# Patient Record
Sex: Female | Born: 1985 | Race: White | Hispanic: No | Marital: Single | Smoking: Former smoker
Health system: Southern US, Community
[De-identification: ages and names within clinical notes are randomized; demographics above are authoritative.]

## PROBLEM LIST (undated history)

## (undated) DIAGNOSIS — R519 Headache, unspecified: Secondary | ICD-10-CM

## (undated) DIAGNOSIS — I10 Essential (primary) hypertension: Secondary | ICD-10-CM

## (undated) DIAGNOSIS — K219 Gastro-esophageal reflux disease without esophagitis: Secondary | ICD-10-CM

## (undated) DIAGNOSIS — G709 Myoneural disorder, unspecified: Secondary | ICD-10-CM

## (undated) DIAGNOSIS — Q059 Spina bifida, unspecified: Secondary | ICD-10-CM

## (undated) DIAGNOSIS — G600 Hereditary motor and sensory neuropathy: Secondary | ICD-10-CM

---

## 2011-09-10 ENCOUNTER — Emergency Department: Payer: Self-pay | Admitting: Emergency Medicine

## 2012-02-15 ENCOUNTER — Emergency Department: Payer: Self-pay | Admitting: Emergency Medicine

## 2013-03-29 ENCOUNTER — Emergency Department: Payer: Self-pay | Admitting: Internal Medicine

## 2013-09-10 ENCOUNTER — Ambulatory Visit: Payer: Self-pay | Admitting: Orthopedic Surgery

## 2013-10-28 ENCOUNTER — Emergency Department: Payer: Self-pay | Admitting: Emergency Medicine

## 2013-10-28 LAB — CBC
HCT: 40.2 % (ref 35.0–47.0)
HGB: 14 g/dL (ref 12.0–16.0)
MCH: 31.7 pg (ref 26.0–34.0)
MCHC: 34.7 g/dL (ref 32.0–36.0)
MCV: 91 fL (ref 80–100)
PLATELETS: 154 10*3/uL (ref 150–440)
RBC: 4.4 10*6/uL (ref 3.80–5.20)
RDW: 11.9 % (ref 11.5–14.5)
WBC: 6.6 10*3/uL (ref 3.6–11.0)

## 2013-10-28 LAB — HCG, QUANTITATIVE, PREGNANCY: BETA HCG, QUANT.: 3033 m[IU]/mL — AB

## 2013-10-30 ENCOUNTER — Emergency Department: Payer: Self-pay | Admitting: Emergency Medicine

## 2013-10-30 LAB — HCG, QUANTITATIVE, PREGNANCY: Beta Hcg, Quant.: 485 m[IU]/mL — ABNORMAL HIGH

## 2014-08-31 ENCOUNTER — Emergency Department: Payer: Self-pay | Admitting: Emergency Medicine

## 2014-08-31 LAB — SEDIMENTATION RATE: Erythrocyte Sed Rate: 5 mm/hr (ref 0–20)

## 2018-09-21 ENCOUNTER — Other Ambulatory Visit: Payer: Self-pay | Admitting: Physician Assistant

## 2018-09-21 ENCOUNTER — Ambulatory Visit
Admission: RE | Admit: 2018-09-21 | Discharge: 2018-09-21 | Disposition: A | Discharge status: Home / Self Care | Payer: Medicaid Other | Source: Ambulatory Visit | Attending: Physician Assistant | Admitting: Physician Assistant

## 2018-09-21 DIAGNOSIS — M542 Cervicalgia: Secondary | ICD-10-CM

## 2019-07-05 ENCOUNTER — Ambulatory Visit: Payer: Medicaid Other | Attending: Physician Assistant | Admitting: Physical Therapy

## 2019-07-05 ENCOUNTER — Other Ambulatory Visit: Payer: Self-pay

## 2019-07-05 ENCOUNTER — Encounter: Payer: Self-pay | Admitting: Physical Therapy

## 2019-07-05 DIAGNOSIS — G8929 Other chronic pain: Secondary | ICD-10-CM | POA: Insufficient documentation

## 2019-07-05 DIAGNOSIS — M545 Low back pain, unspecified: Secondary | ICD-10-CM

## 2019-07-05 DIAGNOSIS — M62838 Other muscle spasm: Secondary | ICD-10-CM | POA: Diagnosis present

## 2019-07-05 DIAGNOSIS — M542 Cervicalgia: Secondary | ICD-10-CM | POA: Diagnosis not present

## 2019-07-05 NOTE — Therapy (Signed)
[place] [facility] [address] [place], [address] Phone: [phone]   Fax:  [phone]  Physical Therapy Evaluation  Patient Details  Name: Kathleen Joseph MRN: [id number] Date of Birth: 12-02-1985 Referring Provider (PT): Arvilla Market, [facility]   Encounter Date: 07/05/2019  PT End of Session - 07/05/19 1135    Visit Number  1    Number of Visits  16    Date for PT Re-Evaluation  09/13/19    Authorization Type  Medicaid reporting from 07/05/2019    Authorization Time Period  certification period: 07/05/2019 - 09/13/2019    Authorization - Visit Number  1    Authorization - Number of Visits  1    PT Start Time  6759    PT Stop Time  1116    PT Time Calculation (min)  61 min    Activity Tolerance  Patient tolerated treatment well;Patient limited by pain    Behavior During Therapy  [facility] for tasks assessed/performed       History reviewed. No pertinent past medical history.  History reviewed. No pertinent surgical history.  There were no vitals filed for this visit.   Subjective Assessment - 07/05/19 1028    Subjective  Patient reports the onset of pain since many years ago located at bilateral cervical and lower back without trama or any known injuries. The pain is currently cervical 5/10, back 8/10. Patient reports aggravating factors including: prolonged sitting/standing/walking. Patient has been trying the following methods to relief the pain: heating pack and frequent resting breaks. However, none of the above methods had long lasting effect towards pain relief. Patient is currently taking care of her kids with help from her family. Patient enjoys playing with her kids and wish to do more exercises.    Pertinent History  Patient is a 33 y.o. female who presents to outpatient physical therapy with a referral for medical diagnosis of neck pain and low back pain. This patient's chief complaints consist of constant pain at  neck and lower back that has been chronically starting many years ago, leading to the following functional deficits: difficulty with housework activities, prolonged sitting, quality of life, lifting, etc.. Relevant past medical history and comorbidities include Marie-tooth-Charcot (2017); equivalent angina (2020), SOB, spinal bifida; surgeries include: Cesarean section.    Limitations  Sitting;Walking;Lifting;Standing;House hold activities    How long can you sit comfortably?  1 hour    How long can you stand comfortably?  30 minutes to 1 hour    How long can you walk comfortably?  15 minutes in a slow pace    Diagnostic tests  radiograph C-spine (including open mouth) report 09/21/2018: "Impression: Moderate disc space narrowing at C7-T1. Other disc spaces appear    Patient Stated Goals  Help to reduce the pain and able to do more    Currently in Pain?  Yes    Pain Score  5     Pain Location  Neck    Pain Orientation  Left;Right    Pain Descriptors / Indicators  Aching;Constant;Burning;Spasm    Pain Type  Chronic pain    Pain Onset  More than a month ago    Pain Frequency  Constant    Aggravating Factors   Lifting over 30 lb; neck extension with headachel; neck flexion; turning head fast while drive; prolonged house work    Pain Relieving Factors  Taking breaks during prolonged activites    Effect of Pain on Daily  Activities  Turning head fast while drive; prolonged house work; pick up her kids    Multiple Pain Sites  Yes    Pain Score  8    Pain Location  Back    Pain Orientation  Right;Left;Mid;Lower    Pain Descriptors / Indicators  Constant;Burning    Pain Type  Chronic pain    Pain Onset  More than a month ago    Pain Frequency  Constant    Aggravating Factors   heavy lifting over 30 lb; prolonged ADLs, IADLs    Pain Relieving Factors  Breaks between house working    Effect of Pain on Daily Activities  Turning head fast while drive; prolonged house work; pick up her kids              [facility] PT Assessment - 07/05/19 0001      Assessment   Medical Diagnosis  Cervical pain and lower back pain    Referring Provider (PT)  Mayme Genta, PAC    Onset Date/Surgical Date  --   Many years ago   Hand Dominance  Right    Prior Therapy  None      Precautions   Precautions  None      Restrictions   Weight Bearing Restrictions  No      Balance Screen   Has the patient fallen in the past 6 months  No    Has the patient had a decrease in activity level because of a fear of falling?   Yes      Home Environment   Living Environment  Private residence   Apartment with stairs    Living Arrangements  Children    Available Help at Discharge  Family    Type of Home  Apartment    Home Access  Stairs to enter      Prior Function   Level of Independence  Independent    Vocation  Works at home   taking care of children   Vocation Requirements  house working     Leisure  playing with kids       Cognition   Overall Cognitive Status  Within Functional Limits for tasks assessed      Observation/Other Assessments   Observations  see note from 07/05/2019 for latest objective measures    Focus on Therapeutic Outcomes (FOTO)   FOTO = 46         OBJECTIVE  Patient Specific Functional Scale:  1. Bending over to clean the tub 2. Vacuum the floor 3. Going up and down stairs.  (Deffered rating to next session)  SENSATION: Patient having numbness and tinglings at her BUE and BLE as baseline due to Marie-Tooth-Charcot (MTC)   MUSCULOSKELETAL: Tremor: None  Posture Slouched upper thoracic and excessive lodosis when sitting and walking.   Gait Deferred to later session.   Palpation Very sensitive to light touch at bilateral lower back at L1 to L5; tender palpation at B cervical regions at upper traps.   Strength (out of 5) R/L 4/4 Hip flexion 4-/4- Hip abduction (modified in sitting) 4/4 Hip adduction (modified in sitting) 4+/4+ Knee extension 4/4 Knee  flexion 5/5 Ankle dorsiflexion 4/4 Ankle inversion/eversion 4/4 Elbow flexion 4/4 Elbow extension 4/4 Shoulder abduction *Indicates pain   AROM (degrees) Cervical:  *Extension: 20 *Forward Flexion 25 L SB 40  R SB 25 (muscle tightness at L side neck)  Thoracic: Thoracic rotation with B arms cross over the chest R/L [facility]; no pain  with   Lumbar:  Forward flexion/side flexion WFL no pain  *Indicates pain   Repeated Movements Deferred to later session      Passive Accessory Intervertebral Motion (PAIVM) Deferred due to patient sensitivity to light touch at her lower back.    Objective measurements completed on examination: See above findings.    TREATMENT:   Therapeutic exercise: to centralize symptoms and improve ROM, strength, muscular endurance, and activity tolerance required for successful completion of functional activities.  - Seated cervical side bend with self overpressure   - RSB unable to achieve due to pain/lightheadedness with overpressure  - L SB 15 s with overpressure - Patient able to complete modified stretching exercises without overpressure and pain free. Patient demonstrated good understanding towards techniques and forms.  - Patient was educated on diagnosis, anatomy and pathology involved, prognosis, role of PT, and was given an HEP, demonstrating exercise with proper form following verbal and tactile cues, and was given a paper hand out to continue exercise at home. Pt was educated on and agreed to plan of care. - Education on HEP including handout   HOME EXERCISE PROGRAM Access Code: [email or web address]  URL: [email or web address]  Date: 07/05/2019  Prepared by: Norton BlizzardSara Snyder   Exercises Seated Cervical Sidebend with self Overpressure - Repeated Motions - 3 sets - 30 hold - 1x daily - 7x weekly Seated Transversus Abdominis Bracing - 1 sets - 20 reps - 2 hold - 1x daily - 7x weekly     PT Education - 07/05/19 1135    Education Details   Exercise purpose/form. Self management techniques. Education on diagnosis, prognosis, POC, anatomy and physiology of current condition Education on HEP including handout    Person(s) Educated  Patient    Methods  Explanation;Demonstration;Tactile cues;Verbal cues;Handout    Comprehension  Verbalized understanding;Returned demonstration;Verbal cues required;Tactile cues required       PT Short Term Goals - 07/05/19 1149      PT SHORT TERM GOAL #1   Title  Be independent with initial home exercise program for self-management of symptoms.    Baseline  initial HEP provided at IE (07/05/2019);    Time  2    Period  Weeks    Status  New    Target Date  07/26/19        PT Long Term Goals - 07/05/19 1150      PT LONG TERM GOAL #1   Title  Be independent with a long-term home exercise program for self-management of symptoms.    Baseline  initial HEP provided at IE (07/05/2019);    Time  10    Period  Weeks    Status  New    Target Date  09/13/19      PT LONG TERM GOAL #2   Title  Demonstrate improved FOTO score by 10 units to demonstrate improvement in overall condition and self-reported functional ability.    Baseline  FOTO: 46 (07/05/2019)    Time  10    Period  Weeks    Status  New    Target Date  09/13/19      PT LONG TERM GOAL #3   Title  Complete community, work and/or recreational activities without limitation due to current condition.    Baseline  heavy lifting over 30 lb; prolonged ADLs, IADLs; Turning head fast while drive; prolonged house work; pick up her kids (07/05/2019)    Time  10    Period  Weeks    Status  New  Target Date  09/13/19      PT LONG TERM GOAL #4   Title  Reduce pain with functional activities to equal or less than 1/10 to allow patient to complete usual activities including ADLs, IADLs, and social engagement with less difficulty.    Baseline  5/10 neck; 8/10 back(07/05/2019)    Time  10    Period  Weeks    Status  New    Target Date   09/13/19      PT LONG TERM GOAL #5   Title  Increase cervical ROM to 60 degrees for flexion/extension and 45 degrees to R/L side flexion to be able to complete ADLs/IADLs throughout the day    Baseline  Neck extenison 20 degrees; flexion 25 degrees; side bending R 25 L 40 degrees (07/05/2019)    Time  10    Period  Weeks    Status  New    Target Date  09/13/19             Plan - 07/05/19 1140    Clinical Impression Statement  Patient is a 33 y.o. female who presents to outpatient physical therapy with a referral for medical diagnosis of neck pain and low back pain. This patient presents with the sign and symptoms consistent with neck and low back pain, stiffness, muscle spasm with functional activities/recreational activities. Upon assessment, pt demonstrated deficits such as significant tenderness to palpation of paraspinal muscle at L2 to L5, as well as deficits in strength, mobility, weakness, significant pain with mobility, and limited neck ROM. These deficits limit the patient ability to perform things such as ADLs, IADLs, social participation, caring for and playing with children, engaging in hobbies (playing with kids), and impairs their quality of life. The pt will benefit from skilled PT services to address deficits and return to PLOF and independence, recreational activity and work.    Personal Factors and Comorbidities  Comorbidity 3+    Comorbidities  Marie-tooth-Charcot (2017); equivalent angina (2020), SOB, spinal bifida; surgeries include: Cesarean section.    Examination-Activity Limitations  Bend;Caring for Others;Carry;Lift;Sleep;Sit;Stand;Stairs    Examination-Participation Restrictions  Driving;Community Activity;Cleaning;Meal Prep;Laundry    Stability/Clinical Decision Making  Evolving/Moderate complexity    Clinical Decision Making  Moderate    Rehab Potential  Fair    PT Frequency  2x / week    PT Duration  Other (comment)   10 weeks   PT Treatment/Interventions   ADLs/Self Care Home Management;Cryotherapy;Aquatic Therapy;Electrical Stimulation;Moist Heat;Traction;Ultrasound;Gait Network engineer;Therapeutic activities;Therapeutic exercise;Balance training;Neuromuscular re-education;Patient/family education;Manual techniques;Energy conservation;Dry needling;Joint Manipulations;Other (comment)   joint mobilizations grades I-IV   PT Next Visit Plan  Stretching, muscle strengthening, postural education    PT Home Exercise Plan  Medbridge: [email or web address]    Consulted and Agree with Plan of Care  Patient       Patient will benefit from skilled therapeutic intervention in order to improve the following deficits and impairments:  Decreased activity tolerance, Decreased endurance, Decreased range of motion, Decreased strength, Impaired perceived functional ability, Impaired UE functional use, Improper body mechanics, Pain, Postural dysfunction, Impaired flexibility, Decreased balance, Difficulty walking, Increased muscle spasms  Visit Diagnosis: Cervicalgia  Chronic bilateral low back pain without sciatica  Other muscle spasm     Problem List There are no active problems to display for this patient.  Nelly Rout, SPT 07/05/19, 7:12 PM  Luretha Murphy. Ilsa Iha, PT, DPT 07/05/19, 7:12 PM  [facility] [address], [place], [address] Phone:  [phone]   Fax:  [phone]  Name: Kathleen Joseph MRN: [id number] Date of Birth: 1986-06-19

## 2019-07-12 ENCOUNTER — Encounter: Payer: Self-pay | Admitting: Physical Therapy

## 2019-07-12 ENCOUNTER — Ambulatory Visit: Payer: Medicaid Other | Admitting: Physical Therapy

## 2019-07-12 ENCOUNTER — Other Ambulatory Visit: Payer: Self-pay

## 2019-07-12 DIAGNOSIS — M545 Low back pain, unspecified: Secondary | ICD-10-CM

## 2019-07-12 DIAGNOSIS — M542 Cervicalgia: Secondary | ICD-10-CM | POA: Diagnosis not present

## 2019-07-12 DIAGNOSIS — G8929 Other chronic pain: Secondary | ICD-10-CM

## 2019-07-12 DIAGNOSIS — M62838 Other muscle spasm: Secondary | ICD-10-CM

## 2019-07-12 NOTE — Therapy (Addendum)
[facility] [address] [place], [address] Phone: [phone]   Fax:  [phone]  Physical Therapy Treatment  Patient Details  Name: Kathleen Joseph MRN: [id number] Date of Birth: 28-Apr-1986 Referring Provider (PT): Mayme Genta, [facility]   Encounter Date: 07/12/2019  PT End of Session - 07/12/19 1503    Visit Number  2    Number of Visits  16    Date for PT Re-Evaluation  09/13/19    Authorization Type  Medicaid reporting from 07/05/2019    Authorization Time Period  certification period: 07/05/2019 - 09/13/2019    Authorization - Visit Number  1    Authorization - Number of Visits  10    PT Start Time  1434    PT Stop Time  1515    PT Time Calculation (min)  41 min    Activity Tolerance  Patient tolerated treatment well;Patient limited by pain    Behavior During Therapy  [facility] for tasks assessed/performed       History reviewed. No pertinent past medical history.  History reviewed. No pertinent surgical history.  There were no vitals filed for this visit.  Subjective Assessment - 07/12/19 1501    Subjective  Patient reports no change of her neck and back pain since last visit. She has been doing her HEP and felt good right after the stretching. Rated pain as 7/10 for her neck and back.    Pertinent History  Patient is a 33 y.o. female who presents to outpatient physical therapy with a referral for medical diagnosis of neck pain and low back pain. This patient's chief complaints consist of constant pain at neck and lower back that has been chronically starting many years ago, leading to the following functional deficits: difficulty with housework activities, prolonged sitting, quality of life, lifting, etc.. Relevant past medical history and comorbidities include Marie-tooth-Charcot (2017); equivalent angina (2020), SOB, spinal bifida; surgeries include: Cesarean section.    Limitations   Sitting;Walking;Lifting;Standing;House hold activities    How long can you sit comfortably?  1 hour    How long can you stand comfortably?  30 minutes to 1 hour    How long can you walk comfortably?  15 minutes in a slow pace    Diagnostic tests  radiograph C-spine (including open mouth) report 09/21/2018: "Impression: Moderate disc space narrowing at C7-T1. Other disc spaces appear    Patient Stated Goals  Help to reduce the pain and able to do more    Currently in Pain?  Yes    Pain Score  7     Pain Onset  More than a month ago    Pain Onset  More than a month ago       Patient Specific Functional Scale:  1. Bending over to clean the tub: 3 2. Vacuum the floor: 4 3. Going up and down stairs: 3   Repeated Movements Lumbar extension x 10 reps increase irritation at lower back  Lumbar side bend x 10 bilaterally no change of symptoms  Prone press up (REIL)   Flat surface x10, no change, not getting to end range  Hands elevated on yoga blocks x10, no change, getting to end range. Required chin tuck.   Special test:  Lambert Mody purser: negative  Seated alar ligament test: negative in neutral and flexion, unable to properly test in extension.    TREATMENT:  Therapeutic exercise: to centralize symptoms and improve ROM, strength, muscular endurance, and activity  tolerance required for successful completion of functional activities.  - postural correction in seated with lumbar roll with education on how to use at home.  - Supine TA activation x 10reps  - Supine TA activation with marching 2x 10reps each side  - Standing lumbar extension x 10 reps minor increase of lumbar discomfort.  - Standing side bending x 10 reps at each side. No change of symptoms  - Prone press up x 10 reps  - Prone press up x 10 with yoga blocks.   Manual therapy: to reduce pain and tissue tension, improve range of motion, neuromodulation, in order to promote improved ability to complete functional activities. -  STM at bilateral upper traps to reduce tensions, trigger point, and pain control.   Access Code: [email or web address]  URL: [email or web address]  Date: 07/12/2019  Prepared by: Rosita Kea   Exercises Seated Cervical Sidebend with self Overpressure - Repeated Motions - 3 sets - 30 hold - 1x daily - 7x weekly Sidelying Thoracic Rotation with Open Book - 3 sets - 10 reps - 1x daily - 1x weekly Supine March - 3 sets - 10 reps - 1x daily - 7x weekly  Patient tolerated the session fair and continue working progress to her goal. Patient demonstrated increase activity tolerance and ROM with stretching exercise. Patient also states that she felt muscle burning after STM at bilateral upper trap L side more than R side, but able to recover quickly to her baseline.The pt will benefit from skilled PT services to address deficits and return to PLOF and independence, recreational activity and work.      PT Education - 07/12/19 1503    Education Details  Exercise purpose/form. Self management techniques. Updated HEP    Person(s) Educated  Patient    Methods  Explanation;Tactile cues;Demonstration;Verbal cues;Handout    Comprehension  Verbalized understanding;Returned demonstration;Verbal cues required;Tactile cues required       PT Short Term Goals - 07/05/19 1149      PT SHORT TERM GOAL #1   Title  Be independent with initial home exercise program for self-management of symptoms.    Baseline  initial HEP provided at IE (07/05/2019);    Time  2    Period  Weeks    Status  New    Target Date  07/26/19        PT Long Term Goals - 07/05/19 1150      PT LONG TERM GOAL #1   Title  Be independent with a long-term home exercise program for self-management of symptoms.    Baseline  initial HEP provided at IE (07/05/2019);    Time  10    Period  Weeks    Status  New    Target Date  09/13/19      PT LONG TERM GOAL #2   Title  Demonstrate improved FOTO score by 10 units to demonstrate  improvement in overall condition and self-reported functional ability.    Baseline  FOTO: 46 (07/05/2019)    Time  10    Period  Weeks    Status  New    Target Date  09/13/19      PT LONG TERM GOAL #3   Title  Complete community, work and/or recreational activities without limitation due to current condition.    Baseline  heavy lifting over 30 lb; prolonged ADLs, IADLs; Turning head fast while drive; prolonged house work; pick up her kids (07/05/2019)    Time  10    Period  Weeks    Status  New    Target Date  09/13/19      PT LONG TERM GOAL #4   Title  Reduce pain with functional activities to equal or less than 1/10 to allow patient to complete usual activities including ADLs, IADLs, and social engagement with less difficulty.    Baseline  5/10 neck; 8/10 back(07/05/2019)    Time  10    Period  Weeks    Status  New    Target Date  09/13/19      PT LONG TERM GOAL #5   Title  Increase cervical ROM to 60 degrees for flexion/extension and 45 degrees to R/L side flexion to be able to complete ADLs/IADLs throughout the day    Baseline  Neck extenison 20 degrees; flexion 25 degrees; side bending R 25 L 40 degrees (07/05/2019)    Time  10    Period  Weeks    Status  New    Target Date  09/13/19            Plan - 07/12/19 1723    Clinical Impression Statement  Patient tolerated the session fair and continue working progress to her goal. Patient demonstrated increase activity tolerance and ROM with stretching exercise. Patient also states that she felt muscle burning after STM at bilateral upper trap L side more than R side, but able to recover quickly to her baseline.The pt will benefit from skilled PT services to address deficits and return to PLOF and independence, recreational activity and work.    Personal Factors and Comorbidities  Comorbidity 3+    Comorbidities  Marie-tooth-Charcot (2017); equivalent angina (2020), SOB, spinal bifida; surgeries include: Cesarean section.     Examination-Activity Limitations  Bend;Caring for Others;Carry;Lift;Sleep;Sit;Stand;Stairs    Examination-Participation Restrictions  Driving;Community Activity;Cleaning;Meal Prep;Laundry    Stability/Clinical Decision Making  Evolving/Moderate complexity    Rehab Potential  Fair    PT Frequency  2x / week    PT Duration  Other (comment)   10 weeks   PT Treatment/Interventions  ADLs/Self Care Home Management;Cryotherapy;Aquatic Therapy;Electrical Stimulation;Moist Heat;Traction;Ultrasound;Gait Network engineertraining;Stair training;Therapeutic activities;Therapeutic exercise;Balance training;Neuromuscular re-education;Patient/family education;Manual techniques;Energy conservation;Dry needling;Joint Manipulations;Other (comment)   joint mobilizations grades I-IV   PT Next Visit Plan  Stretching, muscle strengthening, postural education    PT Home Exercise Plan  Medbridge: [email or web address]    Consulted and Agree with Plan of Care  Patient       Patient will benefit from skilled therapeutic intervention in order to improve the following deficits and impairments:  Decreased activity tolerance, Decreased endurance, Decreased range of motion, Decreased strength, Impaired perceived functional ability, Impaired UE functional use, Improper body mechanics, Pain, Postural dysfunction, Impaired flexibility, Decreased balance, Difficulty walking, Increased muscle spasms  Visit Diagnosis: Cervicalgia  Chronic bilateral low back pain without sciatica  Other muscle spasm     Problem List There are no active problems to display for this patient.  Nelly Routan Tristine Langi, SPT 07/12/19, 5:25 PM  Luretha MurphySara R. Ilsa IhaSnyder, PT, DPT 07/12/19, 5:30 PM   [facility] [address] [place], [place], [address] Phone: [phone]   Fax:  [phone]  Name: Kathleen Joseph MRN: [id number] Date of Birth: 07/19/1986

## 2019-07-19 ENCOUNTER — Encounter: Payer: Self-pay | Admitting: Physical Therapy

## 2019-07-19 ENCOUNTER — Other Ambulatory Visit: Payer: Self-pay

## 2019-07-19 ENCOUNTER — Ambulatory Visit: Payer: Medicaid Other | Admitting: Physical Therapy

## 2019-07-19 DIAGNOSIS — M542 Cervicalgia: Secondary | ICD-10-CM

## 2019-07-19 DIAGNOSIS — M62838 Other muscle spasm: Secondary | ICD-10-CM

## 2019-07-19 DIAGNOSIS — M545 Low back pain, unspecified: Secondary | ICD-10-CM

## 2019-07-19 DIAGNOSIS — G8929 Other chronic pain: Secondary | ICD-10-CM

## 2019-07-19 NOTE — Therapy (Signed)
[facility] [address] [place], [address] Phone: [phone]   Fax:  [phone]  Physical Therapy Treatment  Patient Details  Name: Kathleen Joseph MRN: [id number] Date of Birth: Jul 14, 1986 Referring Provider (PT): Arvilla Market, [facility]   Encounter Date: 07/19/2019  PT End of Session - 07/19/19 1618    Visit Number  3    Number of Visits  16    Date for PT Re-Evaluation  09/13/19    Authorization Type  Medicaid reporting from 07/05/2019    Authorization Time Period  certification period: 07/05/2019 - 09/13/2019    Authorization - Visit Number  2    Authorization - Number of Visits  10    PT Start Time  5277    PT Stop Time  1433    PT Time Calculation (min)  38 min    Activity Tolerance  Patient tolerated treatment well;Patient limited by pain    Behavior During Therapy  [facility] for tasks assessed/performed       History reviewed. No pertinent past medical history.  History reviewed. No pertinent surgical history.  There were no vitals filed for this visit.  Subjective Assessment - 07/19/19 1357    Subjective  Patient reports pain 6/10 for her neck and back. Patient had a rough day and very emotional.    Pertinent History  Patient is a 33 y.o. female who presents to outpatient physical therapy with a referral for medical diagnosis of neck pain and low back pain. This patient's chief complaints consist of constant pain at neck and lower back that has been chronically starting many years ago, leading to the following functional deficits: difficulty with housework activities, prolonged sitting, quality of life, lifting, etc.. Relevant past medical history and comorbidities include Marie-tooth-Charcot (2017); equivalent angina (2020), SOB, spinal bifida; surgeries include: Cesarean section.    Limitations  Sitting;Walking;Lifting;Standing;House hold activities    How long can you sit comfortably?  1 hour    How long  can you stand comfortably?  30 minutes to 1 hour    How long can you walk comfortably?  15 minutes in a slow pace    Diagnostic tests  radiograph C-spine (including open mouth) report 09/21/2018: "Impression: Moderate disc space narrowing at C7-T1. Other disc spaces appear    Patient Stated Goals  Help to reduce the pain and able to do more    Pain Onset  More than a month ago    Pain Onset  More than a month ago      TREATMENT: Therapeutic exercise:to centralize symptoms and improve ROM, strength, muscular endurance, and activity tolerance required for successful completion of functional activities. - Standing Shoulder row with red therapy band 2x15 reps  - Standing shoulder pull down with red therapy band 2x15 reps - Lats pull down 15/20/25lb 10 reps each  - Gluteal activation in standing 3s hold x 10 reps - Half squat 2x10 reps with cuing to gluteal activation from squat to standing phase. Patient reports she felt the starting of numbness of her hips at the beginning of the second set but able to recover really quickly after out of the position.  - Sit to standing 2 x 10 reps. Same feeling reported as half squat  - Standing repetitive extension x 10 against parallel bar at treadmill.  Cuing provided to improve shoulder relaxation, proper forms and technics.    HOME EXERCISE PROGRAM Access Code: [email or web address]  URL: [email or web address]  Date: 07/19/2019  Prepared by: Rosita Kea   Exercises Seated Cervical Sidebend with self Overpressure - Repeated Motions - 3 sets - 30 hold - 1x daily - 7x weekly Sidelying Thoracic Rotation with Open Book - 3 sets - 10 reps - 1x daily - 1x weekly Supine March - 3 sets - 10 reps - 1x daily - 7x weekly Shoulder extension with resistance - Neutral - 3 sets - 10 reps - 1x daily - 7x weekly Standing Bilateral Low Shoulder Row with Anchored Resistance - 3 sets - 10 reps - 1x daily - 7x weekly  Patient tolerated the session fair and minor  discomfort at lower back at the end. Patient demonstrated good muscle activation with proper form and techniques for UE strengthening exercises. Patient however, had difficulties with squat and pelvic anterior tilt in sitting. Future plan will focus on core stabilization, strengthening as well as postural training. The pt will benefit from skilled PT services to address deficits and return to PLOF and independence, recreational activity and work.       PT Education - 07/19/19 1618    Education Details  Exercise purpose/form. Self management techniques. Updated HEP    Person(s) Educated  Patient    Methods  Explanation;Demonstration;Tactile cues;Verbal cues    Comprehension  Verbalized understanding;Returned demonstration;Verbal cues required;Tactile cues required       PT Short Term Goals - 07/19/19 1409      PT SHORT TERM GOAL #1   Title  Be independent with initial home exercise program for self-management of symptoms.    Baseline  initial HEP provided at IE (07/05/2019);    Time  2    Period  Weeks    Status  Achieved    Target Date  07/26/19        PT Long Term Goals - 07/19/19 1409      PT LONG TERM GOAL #1   Title  Be independent with a long-term home exercise program for self-management of symptoms.    Baseline  initial HEP provided at IE (07/05/2019); updated 07/19/2019    Time  10    Period  Weeks    Status  Partially Met    Target Date  09/13/19      PT LONG TERM GOAL #2   Title  Demonstrate improved FOTO score by 10 units to demonstrate improvement in overall condition and self-reported functional ability.    Baseline  FOTO: 46 (07/05/2019)    Time  10    Period  Weeks    Status  On-going    Target Date  09/13/19      PT LONG TERM GOAL #3   Title  Complete community, work and/or recreational activities without limitation due to current condition.    Baseline  heavy lifting over 30 lb; prolonged ADLs, IADLs; Turning head fast while drive; prolonged house work;  pick up her kids (07/05/2019)    Time  10    Period  Weeks    Status  On-going    Target Date  09/13/19      PT LONG TERM GOAL #4   Title  Reduce pain with functional activities to equal or less than 1/10 to allow patient to complete usual activities including ADLs, IADLs, and social engagement with less difficulty.    Baseline  5/10 neck; 8/10 back(07/05/2019)    Time  10    Period  Weeks    Status  Partially Met    Target Date  09/13/19  PT LONG TERM GOAL #5   Title  Increase cervical ROM to 60 degrees for flexion/extension and 45 degrees to R/L side flexion to be able to complete ADLs/IADLs throughout the day    Baseline  Neck extenison 20 degrees; flexion 25 degrees; side bending R 25 L 40 degrees (07/05/2019)    Time  10    Period  Weeks    Status  On-going    Target Date  09/13/19            Plan - 07/19/19 1617    Clinical Impression Statement  Patient tolerated the session fair and minor discomfort at lower back at the end. Patient demonstrated good muscle activation with proper form and techniques for UE strengthening exercises. Patient however, had difficulties with squat and pelvic anterior tilt in sitting. Future plan will focus on core stabilization, strengthening as well as postural training. The pt will benefit from skilled PT services to address deficits and return to PLOF and independence, recreational activity and work.    Personal Factors and Comorbidities  Comorbidity 3+    Comorbidities  Marie-tooth-Charcot (2017); equivalent angina (2020), SOB, spinal bifida; surgeries include: Cesarean section.    Examination-Activity Limitations  Bend;Caring for Others;Carry;Lift;Sleep;Sit;Stand;Stairs    Examination-Participation Restrictions  Driving;Community Activity;Cleaning;Meal Prep;Laundry    Stability/Clinical Decision Making  Evolving/Moderate complexity    Rehab Potential  Fair    PT Frequency  2x / week    PT Duration  Other (comment)   10 weeks   PT  Treatment/Interventions  ADLs/Self Care Home Management;Cryotherapy;Aquatic Therapy;Electrical Stimulation;Moist Heat;Traction;Ultrasound;Gait Scientist, forensic;Therapeutic activities;Therapeutic exercise;Balance training;Neuromuscular re-education;Patient/family education;Manual techniques;Energy conservation;Dry needling;Joint Manipulations;Other (comment)   joint mobilizations grades I-IV   PT Next Visit Plan  Stretching, muscle strengthening, postural education    PT Home Exercise Plan  Medbridge: [email or web address]    Consulted and Agree with Plan of Care  Patient       Patient will benefit from skilled therapeutic intervention in order to improve the following deficits and impairments:  Decreased activity tolerance, Decreased endurance, Decreased range of motion, Decreased strength, Impaired perceived functional ability, Impaired UE functional use, Improper body mechanics, Pain, Postural dysfunction, Impaired flexibility, Decreased balance, Difficulty walking, Increased muscle spasms  Visit Diagnosis: Cervicalgia  Chronic bilateral low back pain without sciatica  Other muscle spasm     Problem List There are no active problems to display for this patient.   Sherrilyn Rist, SPT 07/19/19, 4:31 PM  Everlean Alstrom. Graylon Good, PT, DPT 07/19/19, 4:32 PM   [facility] [address] [place], [address] Phone: [phone]   Fax:  [phone]  Name: Kathleen Joseph MRN: [id number] Date of Birth: 04/06/1986

## 2019-07-26 ENCOUNTER — Ambulatory Visit: Payer: Medicaid Other | Admitting: Physical Therapy

## 2019-07-27 ENCOUNTER — Ambulatory Visit: Payer: Medicaid Other | Admitting: Physical Therapy

## 2019-07-27 ENCOUNTER — Encounter: Payer: Self-pay | Admitting: Physical Therapy

## 2019-07-27 ENCOUNTER — Other Ambulatory Visit: Payer: Self-pay

## 2019-07-27 DIAGNOSIS — M62838 Other muscle spasm: Secondary | ICD-10-CM

## 2019-07-27 DIAGNOSIS — M545 Low back pain, unspecified: Secondary | ICD-10-CM

## 2019-07-27 DIAGNOSIS — G8929 Other chronic pain: Secondary | ICD-10-CM

## 2019-07-27 DIAGNOSIS — M542 Cervicalgia: Secondary | ICD-10-CM

## 2019-07-27 NOTE — Therapy (Signed)
[facility] [address], [place], [address] Phone: [phone]   Fax:  [phone]  Physical Therapy Treatment/Progress Note  Reporting period 07/05/2019 to 07/27/2019  Patient Details  Name: Kathleen Joseph MRN: [id number] Date of Birth: [birth year] Referring Provider (PT): Arvilla Market, [facility]   Encounter Date: 07/27/2019  PT End of Session - 07/27/19 1356    Visit Number  4    Number of Visits  16    Date for PT Re-Evaluation  09/13/19    Authorization Type  Medicaid reporting from 07/05/2019    Authorization Time Period  certification period: 07/05/2019 - 09/13/2019    Authorization - Visit Number  3    Authorization - Number of Visits  3    PT Start Time  1344    PT Stop Time  1430    PT Time Calculation (min)  46 min    Activity Tolerance  Patient tolerated treatment well;Patient limited by pain    Behavior During Therapy  [facility] for tasks assessed/performed       History reviewed. No pertinent past medical history.  History reviewed. No pertinent surgical history.  There were no vitals filed for this visit.  Subjective Assessment - 07/27/19 1344    Subjective  Patient reports pain 7/10 for her neck and back. Patient states she didn't feel changes to her condition related to her pain since the PT treatment. She felt increased lower back pain after last session by doing repeated half squating    Pertinent History  Patient is a 33 y.o. female who presents to outpatient physical therapy with a referral for medical diagnosis of neck pain and low back pain. This patient's chief complaints consist of constant pain at neck and lower back that has been chronically starting many years ago, leading to the following functional deficits: difficulty with housework activities, prolonged sitting, quality of life, lifting, etc.. Relevant past medical history and comorbidities include Marie-tooth-Charcot (2017); equivalent  angina (2020), SOB, spinal bifida; surgeries include: Cesarean section.    Limitations  Sitting;Walking;Lifting;Standing;House hold activities    How long can you sit comfortably?  1 hour    How long can you stand comfortably?  30 minutes to 1 hour    How long can you walk comfortably?  15 minutes in a slow pace    Diagnostic tests  radiograph C-spine (including open mouth) report 09/21/2018: "Impression: Moderate disc space narrowing at C7-T1. Other disc spaces appear    Patient Stated Goals  Help to reduce the pain and able to do more    Currently in Pain?  Yes    Pain Score  7     Pain Location  Back    Pain Orientation  Left;Right    Pain Onset  More than a month ago    Pain Onset  More than a month ago      OBJECTIVE:  AROM (degrees) Cervical:  Extension: 30 Forward Flexion 33 L SB 45 R SB 40  No aggravating of pain at all motions  TREATMENT: Therapeutic exercise:to centralize symptoms and improve ROM, strength, muscular endurance, and activity tolerance required for successful completion of functional activities. - Upper body ergometer with no added resistance encourage joint nutrition, warm tissue, induce analgesic effect of aerobic exercise, improve muscular strength and endurance,  and prepare for remainder of session. x8 minutes during subjective exam.   - Standing Shoulder row with red therapy band 2x15 reps  - Standing shoulder pull  down with red therapy band 2x15 reps - Supine TA activation with marching 2x10 each LEs. Minimal increase of discomfort and HEP updated.  - Neck flexion, extension, side flexion x 2 to the end range.  - Patient was educated on treatment progress, anatomy and pathology involved, prognosis, role of PT, and was given an updated HEP, demonstrating exercise with proper form following verbal and tactile cues, and was given a paper hand out to continue exercise at home. Pt was educated on and agreed to plan of care. - (completed FOTO after end of  session unbilled)  Access Code: [email or web address]  URL: [email or web address]  Date: 07/27/2019  Prepared by: Rosita Kea   Exercises Seated Cervical Sidebend with self Overpressure - Repeated Motions - 3 sets - 30 hold - 1x daily - 7x weekly Sidelying Thoracic Rotation with Open Book - 3 sets - 10 reps - 1x daily - 1x weekly Supine March - 3 sets - 10 reps - 1x daily - 7x weekly Shoulder extension with resistance - Neutral - 3 sets - 10 reps - 1x daily - 7x weekly Standing Bilateral Low Shoulder Row with Anchored Resistance - 3 sets - 10 reps - 1x daily - 7x weekly Supine March - 3 sets - 20 reps - 1x daily - 7x weekly   Patient has attended 4 skilled physical therapy treatment sessions this episode of care and overall presents with fair progress towards stated goals. Subjectively, patient reports feeling no changes to her activity tolerance, ROM, self care pain control and pain reduction. Patient also reported she does not know if physical therapy will help since this is only her 3rd treatment sessions. Objectively, patient demonstrates good progression with increased pain free ROM at cervical regions, but stated the same for activity tolerance (houseworking), functional mobility (FOTO) pain level and and significant high iritability with cervical and lower back pain. Patient has been showing good gain in the PT treatments for allowable pain free ROM. However, patient continues to present with significant strength, pain, and activity tolerance impairments that are limiting ability to complete her usual ADLs, IADLs, community participation, manage her home, taking care of children, bend, lift, and ambulate community distances. Patient will benefit from continued skilled physical therapy intervention to address current body structure impairments and activity limitations to improve function and work towards goals set in current POC in order to return to prior level of function or maximal  functional improvement.    PT Education - 07/27/19 1355    Education Details  Exercise purpose/form. Self management techniques. Updated HEP    Person(s) Educated  Patient    Methods  Explanation;Demonstration;Tactile cues;Verbal cues    Comprehension  Verbalized understanding;Returned demonstration;Verbal cues required;Tactile cues required       PT Short Term Goals - 07/19/19 1409      PT SHORT TERM GOAL #1   Title  Be independent with initial home exercise program for self-management of symptoms.    Baseline  initial HEP provided at IE (07/05/2019);    Time  2    Period  Weeks    Status  Achieved    Target Date  07/26/19        PT Long Term Goals - 07/27/19 1708      PT LONG TERM GOAL #1   Title  Be independent with a long-term home exercise program for self-management of symptoms.    Baseline  initial HEP provided at IE (07/05/2019); updated 07/19/2019    Time  10  Period  Weeks    Status  On-going    Target Date  09/13/19      PT LONG TERM GOAL #2   Title  Demonstrate improved FOTO score by 10 units to demonstrate improvement in overall condition and self-reported functional ability.    Baseline  FOTO: 46 (07/05/2019); FOTO 46% (07/27/2019)    Time  10    Period  Weeks    Status  On-going    Target Date  09/13/19      PT LONG TERM GOAL #3   Title  Complete community, work and/or recreational activities without limitation due to current condition.    Baseline  heavy lifting over 30 lb; prolonged ADLs, IADLs; Turning head fast while drive; prolonged house work; pick up her kids (07/05/2019); Less difficulites with turnning head side ways while driving 06/23/7628_    Time  10    Period  Weeks    Status  On-going    Target Date  09/13/19      PT LONG TERM GOAL #4   Title  Reduce pain with functional activities to equal or less than 1/10 to allow patient to complete usual activities including ADLs, IADLs, and social engagement with less difficulty.    Baseline   5/10 neck; 8/10 back(07/05/2019); 7/10 for both neck and back 07/27/2019    Time  10    Period  Weeks    Status  Partially Met    Target Date  09/13/19      PT LONG TERM GOAL #5   Title  Increase cervical ROM to 60 degrees for flexion/extension and 45 degrees to R/L side flexion to be able to complete ADLs/IADLs throughout the day    Baseline  Neck extenison 20 degrees; flexion 25 degrees; side bending R 25 L 40 degrees (07/05/2019);Neck extenison 30 degrees; flexion 33 degrees; side bending R 40 L 44 degrees no pain(07/27/2019);    Time  10    Period  Weeks    Status  On-going    Target Date  09/13/19            Plan - 07/27/19 1707    Clinical Impression Statement  Patient has attended 4 skilled physical therapy treatment sessions this episode of care and overall presents with fair progress towards stated goals. Subjectively, patient reports feeling no changes to her activity tolerance, ROM, self care pain control and pain reduction. Patient also reported she does not know if physical therapy will help since this is only her 3rd treatment sessions. Objectively, patient demonstrates good progression with increased pain free ROM at cervical regions, but stated the same for activity tolerance (houseworking), functional mobility (FOTO) pain level and and significant high iritability with cervical and lower back pain. Patient has been showing good gain in the PT treatments for allowable pain free ROM. However, patient continues to present with significant strength, pain, and activity tolerance impairments that are limiting ability to complete her usual ADLs, IADLs, community participation, manage her home, taking care of children, bend, lift, and ambulate community distances. Patient will benefit from continued skilled physical therapy intervention to address current body structure impairments and activity limitations to improve function and work towards goals set in current POC in order to return  to prior level of function or maximal functional improvement.    Personal Factors and Comorbidities  Comorbidity 3+    Comorbidities  Marie-tooth-Charcot (2017); equivalent angina (2020), SOB, spinal bifida; surgeries include: Cesarean section.    Examination-Activity Limitations  Bend;Caring for Others;Carry;Lift;Sleep;Sit;Stand;Stairs    Examination-Participation Restrictions  Driving;Community Activity;Cleaning;Meal Prep;Laundry    Stability/Clinical Decision Making  Evolving/Moderate complexity    Rehab Potential  Fair    PT Frequency  2x / week    PT Duration  Other (comment)   10 weeks   PT Treatment/Interventions  ADLs/Self Care Home Management;Cryotherapy;Aquatic Therapy;Electrical Stimulation;Moist Heat;Traction;Ultrasound;Gait Scientist, forensic;Therapeutic activities;Therapeutic exercise;Balance training;Neuromuscular re-education;Patient/family education;Manual techniques;Energy conservation;Dry needling;Joint Manipulations;Other (comment)   joint mobilizations grades I-IV   PT Next Visit Plan  Stretching, muscle strengthening, postural education    PT Home Exercise Plan  Medbridge: [email or web address]    Consulted and Agree with Plan of Care  Patient       Patient will benefit from skilled therapeutic intervention in order to improve the following deficits and impairments:  Decreased activity tolerance, Decreased endurance, Decreased range of motion, Decreased strength, Impaired perceived functional ability, Impaired UE functional use, Improper body mechanics, Pain, Postural dysfunction, Impaired flexibility, Decreased balance, Difficulty walking, Increased muscle spasms  Visit Diagnosis: Cervicalgia  Chronic bilateral low back pain without sciatica  Other muscle spasm     Problem List There are no active problems to display for this patient.   Sherrilyn Rist, SPT 07/27/19, 5:50 PM  Everlean Alstrom. Graylon Good, PT, DPT 07/27/19, 5:50 PM   [facility] [address], [place], [address] Phone: [phone]   Fax:  [phone]  Name: Jameela Michna MRN: [id number] Date of Birth: 13-Nov-1985

## 2019-08-04 ENCOUNTER — Ambulatory Visit: Payer: Medicaid Other | Attending: Physician Assistant | Admitting: Physical Therapy

## 2019-08-09 ENCOUNTER — Ambulatory Visit: Payer: Medicaid Other | Admitting: Physical Therapy

## 2019-08-09 ENCOUNTER — Telehealth: Payer: Self-pay | Admitting: Physical Therapy

## 2019-08-09 NOTE — Telephone Encounter (Signed)
Patient called this PM due to no show at this AM PT appointment at 1115am. Patient stated that she forgot her appointment and she also had to take her daughter to dental's appointment. Patient informed for the further appointment on 08/11/2019 at 1030 am. Patient states that "I don't know if I can come and I will call back".   Sherrilyn Rist, SPT 08/09/2019   Student physical therapist under direct supervision of licensed physical therapists during the entirety of the session.   Everlean Alstrom. Graylon Good, PT, DPT 08/09/19, 4:51 PM

## 2019-08-11 ENCOUNTER — Ambulatory Visit: Payer: Medicaid Other | Admitting: Physical Therapy

## 2019-08-11 ENCOUNTER — Telehealth: Payer: Self-pay | Admitting: Physical Therapy

## 2019-08-11 NOTE — Telephone Encounter (Signed)
Called patient after she did not show up for her 10:30 appointment today. Patient answered and I informed her of the missed visit. She stated that she thought [place] told her the appointment was next Wednesday, not today. I offered times she could re-schedule. She said she could not come today because she has her kids with her, but agreed to come tomorrow 08/12/2019 at 10:30am.   [name] Graylon Good, PT, DPT 08/11/19, 10:59 AM

## 2019-08-12 ENCOUNTER — Encounter: Payer: Medicaid Other | Admitting: Physical Therapy

## 2019-08-12 DIAGNOSIS — M542 Cervicalgia: Secondary | ICD-10-CM

## 2019-08-12 DIAGNOSIS — G8929 Other chronic pain: Secondary | ICD-10-CM

## 2019-08-12 DIAGNOSIS — M62838 Other muscle spasm: Secondary | ICD-10-CM

## 2019-08-12 DIAGNOSIS — M545 Low back pain, unspecified: Secondary | ICD-10-CM

## 2019-08-12 NOTE — Therapy (Signed)
[facility] [address] [place], [address] Phone: [phone]   Fax:  [phone]  Physical Therapy Discharge Reporting period: 07/05/2019 - 08/12/2019  Patient Details  Name: Kathleen Joseph MRN: [id number] Date of Birth: Aug 28, 1986 Referring Provider (PT): Arvilla Market, [facility]   Encounter Date: 08/12/2019    No past medical history on file.  No past surgical history on file.  There were no vitals filed for this visit.  Subjective Assessment - 08/12/19 1904    Subjective  Patient called and left a message with front office staff that her pain med doctor told her she should dc PT since it was hurting her more than helping and requested to discharge from PT    Pertinent History  Patient is a 33 y.o. female who presents to outpatient physical therapy with a referral for medical diagnosis of neck pain and low back pain. This patient's chief complaints consist of constant pain at neck and lower back that has been chronically starting many years ago, leading to the following functional deficits: difficulty with housework activities, prolonged sitting, quality of life, lifting, etc.. Relevant past medical history and comorbidities include Marie-tooth-Charcot (2017); equivalent angina (2020), SOB, spinal bifida; surgeries include: Cesarean section.    Limitations  Sitting;Walking;Lifting;Standing;House hold activities    How long can you sit comfortably?  1 hour    How long can you stand comfortably?  30 minutes to 1 hour    How long can you walk comfortably?  15 minutes in a slow pace    Diagnostic tests  radiograph C-spine (including open mouth) report 09/21/2018: "Impression: Moderate disc space narrowing at C7-T1. Other disc spaces appear    Patient Stated Goals  Help to reduce the pain and able to do more    Pain Onset  More than a month ago    Pain Onset  More than a month ago       Objective:  Patient did not  present at discharge session for examination. Please see the previous notes for latest objective data.   Clinical impression:  Patient attended 4 therapy sessions. Patient demonstrated fair progress towards goals.. During this episode of care, patient had 4 visits,1 no show and 1 no show with rescheduling. Patient called this morning said her pain med doctor told her she should dc PT since it was hurting her more than helping and requested to discharge from PT. Patient now will be discharged due to her and her MD request.           PT Short Term Goals - 07/19/19 1409      PT SHORT TERM GOAL #1   Title  Be independent with initial home exercise program for self-management of symptoms.    Baseline  initial HEP provided at IE (07/05/2019);    Time  2    Period  Weeks    Status  Achieved    Target Date  07/26/19        PT Long Term Goals - 08/12/19 1737      PT LONG TERM GOAL #1   Title  Be independent with a long-term home exercise program for self-management of symptoms.    Baseline  initial HEP provided at IE (07/05/2019); updated 07/19/2019    Time  10    Period  Weeks    Status  Partially Met    Target Date  09/13/19      PT LONG TERM GOAL #2  Title  Demonstrate improved FOTO score by 10 units to demonstrate improvement in overall condition and self-reported functional ability.    Baseline  FOTO: 46 (07/05/2019); FOTO 46% (07/27/2019)    Time  10    Period  Weeks    Status  Partially Met    Target Date  09/13/19      PT LONG TERM GOAL #3   Title  Complete community, work and/or recreational activities without limitation due to current condition.    Baseline  heavy lifting over 30 lb; prolonged ADLs, IADLs; Turning head fast while drive; prolonged house work; pick up her kids (07/05/2019); Less difficulites with turnning head side ways while driving 01/65/5374_    Time  10    Period  Weeks    Status  Partially Met    Target Date  09/13/19      PT LONG TERM GOAL #4    Title  Reduce pain with functional activities to equal or less than 1/10 to allow patient to complete usual activities including ADLs, IADLs, and social engagement with less difficulty.    Baseline  5/10 neck; 8/10 back(07/05/2019); 7/10 for both neck and back 07/27/2019    Time  10    Period  Weeks    Status  Partially Met    Target Date  09/13/19      PT LONG TERM GOAL #5   Title  Increase cervical ROM to 60 degrees for flexion/extension and 45 degrees to R/L side flexion to be able to complete ADLs/IADLs throughout the day    Baseline  Neck extenison 20 degrees; flexion 25 degrees; side bending R 25 L 40 degrees (07/05/2019);Neck extenison 30 degrees; flexion 33 degrees; side bending R 40 L 44 degrees no pain(07/27/2019);    Time  10    Period  Weeks    Status  Partially Met    Target Date  09/13/19            Plan - 08/12/19 1905    Clinical Impression Statement  Patient attended 4 therapy sessions. Patient demonstrated fair progress towards goals.. During this episode of care, patient had 4 visits,1 no show and 1 no show with rescheduling. Patient called this morning said her pain med doctor told her she should dc PT since it was hurting her more than helping and requested to discharge from PT. Patient now will be discharged due to her and her MD request.    Personal Factors and Comorbidities  Comorbidity 3+    Comorbidities  Marie-tooth-Charcot (2017); equivalent angina (2020), SOB, spinal bifida; surgeries include: Cesarean section.    Examination-Activity Limitations  Bend;Caring for Others;Carry;Lift;Sleep;Sit;Stand;Stairs    Examination-Participation Restrictions  Driving;Community Activity;Cleaning;Meal Prep;Laundry    Stability/Clinical Decision Making  Evolving/Moderate complexity    Rehab Potential  Fair    PT Frequency  2x / week    PT Duration  Other (comment)   10 weeks   PT Treatment/Interventions  ADLs/Self Care Home Management;Cryotherapy;Aquatic  Therapy;Electrical Stimulation;Moist Heat;Traction;Ultrasound;Gait Scientist, forensic;Therapeutic activities;Therapeutic exercise;Balance training;Neuromuscular re-education;Patient/family education;Manual techniques;Energy conservation;Dry needling;Joint Manipulations;Other (comment)   joint mobilizations grades I-IV   PT Next Visit Plan  Patient is now discharged from physical therapy per reccomendation of pain physician per pt.    PT [place]: [id number]    Consulted and Agree with Plan of Care  Patient       Patient will benefit from skilled therapeutic intervention in order to improve the following deficits and impairments:  Decreased activity tolerance,  Decreased endurance, Decreased range of motion, Decreased strength, Impaired perceived functional ability, Impaired UE functional use, Improper body mechanics, Pain, Postural dysfunction, Impaired flexibility, Decreased balance, Difficulty walking, Increased muscle spasms  Visit Diagnosis: Cervicalgia  Chronic bilateral low back pain without sciatica  Other muscle spasm     Problem List There are no active problems to display for this patient.   Sherrilyn Rist, SPT 08/12/19, 7:06 PM   Everlean Alstrom. Graylon Good, PT, DPT 08/12/19, 7:06 PM    [facility] [address], [place], [address] Phone: [phone]   Fax:  [phone]  Name: Kathleen Joseph MRN: [id number] Date of Birth: January 23, 1986

## 2019-08-16 ENCOUNTER — Ambulatory Visit: Payer: Medicaid Other | Admitting: Physical Therapy

## 2019-08-18 ENCOUNTER — Encounter: Payer: Medicaid Other | Admitting: Physical Therapy

## 2019-08-23 ENCOUNTER — Encounter: Payer: Medicaid Other | Admitting: Physical Therapy

## 2019-08-25 ENCOUNTER — Encounter: Payer: Medicaid Other | Admitting: Physical Therapy

## 2019-08-30 ENCOUNTER — Encounter: Payer: Medicaid Other | Admitting: Physical Therapy

## 2019-09-01 ENCOUNTER — Encounter: Payer: Medicaid Other | Admitting: Physical Therapy

## 2019-09-02 ENCOUNTER — Other Ambulatory Visit: Payer: Self-pay | Admitting: Physician Assistant

## 2019-09-02 DIAGNOSIS — M542 Cervicalgia: Secondary | ICD-10-CM

## 2019-09-02 DIAGNOSIS — R29898 Other symptoms and signs involving the musculoskeletal system: Secondary | ICD-10-CM

## 2019-09-06 ENCOUNTER — Encounter: Payer: Medicaid Other | Admitting: Physical Therapy

## 2019-09-08 ENCOUNTER — Encounter: Payer: Medicaid Other | Admitting: Physical Therapy

## 2019-09-13 ENCOUNTER — Encounter: Payer: Medicaid Other | Admitting: Physical Therapy

## 2019-09-16 ENCOUNTER — Other Ambulatory Visit: Payer: Self-pay

## 2019-09-16 ENCOUNTER — Ambulatory Visit
Admission: RE | Admit: 2019-09-16 | Discharge: 2019-09-16 | Disposition: A | Discharge status: Home / Self Care | Payer: Medicaid Other | Source: Ambulatory Visit | Attending: Physician Assistant | Admitting: Physician Assistant

## 2019-09-16 DIAGNOSIS — R29898 Other symptoms and signs involving the musculoskeletal system: Secondary | ICD-10-CM | POA: Diagnosis present

## 2019-09-16 DIAGNOSIS — M542 Cervicalgia: Secondary | ICD-10-CM | POA: Diagnosis not present

## 2019-10-07 ENCOUNTER — Other Ambulatory Visit: Payer: Self-pay | Admitting: Physician Assistant

## 2019-10-07 DIAGNOSIS — G950 Syringomyelia and syringobulbia: Secondary | ICD-10-CM

## 2019-10-15 ENCOUNTER — Other Ambulatory Visit: Payer: Self-pay

## 2019-10-15 ENCOUNTER — Ambulatory Visit
Admission: RE | Admit: 2019-10-15 | Discharge: 2019-10-15 | Disposition: A | Discharge status: Home / Self Care | Payer: Medicaid Other | Source: Ambulatory Visit | Attending: Physician Assistant | Admitting: Physician Assistant

## 2019-10-15 DIAGNOSIS — G950 Syringomyelia and syringobulbia: Secondary | ICD-10-CM | POA: Diagnosis not present

## 2019-10-15 LAB — POCT I-STAT CREATININE: Creatinine, Ser: 0.8 mg/dL (ref 0.44–1.00)

## 2019-10-15 MED ORDER — GADOBUTROL 1 MMOL/ML IV SOLN
6.0000 mL | Freq: Once | INTRAVENOUS | Status: AC | PRN
  Administered 2019-10-15: 20:00:00 7.5 mL via INTRAVENOUS

## 2020-06-09 ENCOUNTER — Emergency Department
Admission: EM | Admit: 2020-06-09 | Discharge: 2020-06-09 | Disposition: A | Discharge status: Home / Self Care | Payer: Medicaid Other | Attending: Emergency Medicine | Admitting: Emergency Medicine

## 2020-06-09 DIAGNOSIS — I10 Essential (primary) hypertension: Secondary | ICD-10-CM | POA: Diagnosis present

## 2020-06-09 DIAGNOSIS — U071 COVID-19: Secondary | ICD-10-CM | POA: Diagnosis not present

## 2020-06-09 NOTE — ED Triage Notes (Signed)
Pt to ED reporting COVID like symptoms since Tuesday and is requesting testing. PT reports headaches and cold chills. No fevers at home. Pt has a hx of HTN and went to [facility] to check her BP and was concerned when it was elevated. Pt has been taking prescribed BP medication as prescribed. reports she checked her BP at [facility] and it was elevated

## 2020-06-09 NOTE — ED Notes (Signed)
Patient declined discharge vital signs. 

## 2020-06-09 NOTE — ED Provider Notes (Signed)
[facility] EMERGENCY DEPARTMENT Provider Note   CSN: [id number] Arrival date & time: 06/09/20  7017     History Chief Complaint  Patient presents with  . COVID symptoms  . Hypertension    Kathleen Joseph is a 34 y.o. female presents to the emergency department for evaluation of of high blood pressure and positive Covid test.  States 3 days ago she had sudden onset headaches fevers chills body aches cough and congestion.  She was exposed to her husband who was positive for Covid and today she went had a Covid test.  She was noted to have high blood pressure, 160/120 and come to the ER for evaluation and also to have her kids tested for Covid.  Patient had been taking anti-inflammatory indications as well as cough and cold medications with pseudoephedrine and phenylephrine.  She is on hydrochlorothiazide 7.5 mg daily.  She states her symptoms have subsided, no longer having any fevers chills body aches or cough.  She does have a little bit of a mild headache but overall she feels as if she is feeling much better.  She denies any vision changes, chest pain or shortness of breath  HPI     No past medical history on file.  There are no problems to display for this patient.   No past surgical history on file.   OB History   No obstetric history on file.     No family history on file.  Social History   Tobacco Use  . Smoking status: Not on file  Substance Use Topics  . Alcohol use: Not on file  . Drug use: Not on file    Home Medications Prior to Admission medications   Not on File    Allergies    Patient has no allergy information on record.  Review of Systems   Review of Systems  Constitutional: Negative for chills and fever.  HENT: Negative for sore throat.   Respiratory: Negative for cough and shortness of breath.   Cardiovascular: Negative for chest pain.  Gastrointestinal: Negative for nausea and vomiting.  Genitourinary: Negative for  frequency and pelvic pain.  Skin: Negative for rash and wound.  Neurological: Positive for headaches.    Physical Exam Updated Vital Signs BP (!) 157/95 (BP Location: Left Arm)   Pulse 82   Temp 98.4 F (36.9 C) (Oral)   Resp 16   Ht 5\' 4"  (1.626 m)   Wt 65.8 kg   SpO2 98%   BMI 24.89 kg/m   Physical Exam Constitutional:      Appearance: She is well-developed.  HENT:     Head: Normocephalic and atraumatic.  Eyes:     Conjunctiva/sclera: Conjunctivae normal.  Cardiovascular:     Rate and Rhythm: Normal rate.  Pulmonary:     Effort: Pulmonary effort is normal. No respiratory distress.     Breath sounds: Normal breath sounds. No stridor. No wheezing, rhonchi or rales.  Musculoskeletal:        General: Normal range of motion.     Cervical back: Normal range of motion.  Skin:    General: Skin is warm.     Findings: No rash.  Neurological:     Mental Status: She is alert and oriented to person, place, and time.  Psychiatric:        Behavior: Behavior normal.        Thought Content: Thought content normal.     ED Results / Procedures / Treatments  Labs (all labs ordered are listed, but only abnormal results are displayed) Labs Reviewed - No data to display  EKG None  Radiology No results found.  Procedures Procedures (including critical care time)  Medications Ordered in ED Medications - No data to display  ED Course  I have reviewed the triage vital signs and the nursing notes.  Pertinent labs & imaging results that were available during my care of the patient were reviewed by me and considered in my medical decision making (see chart for details).    MDM Rules/Calculators/A&P                          34 year old female with positive [facility] test [facility].  Symptoms began 3 days ago but all symptoms improving.  No chest pain cough or shortness of breath.  She has a very mild headache.  Afebrile.  Has been taking over-the-counter cough and cold  medication with ibuprofen, will have her discontinue these medications as they could be elevating her blood pressure.  Blood pressure today in the ER 157/95.  She will continue to monitor BP at home and call PCP with twice daily recordings.  She understands signs symptoms return to the ER for. Final Clinical Impression(s) / ED Diagnoses Final diagnoses:  Hypertension, unspecified type  COVID-19    Rx / DC Orders ED Discharge Orders    None       Ronnette Juniper 06/09/20 2117    Phineas Semen, MD 06/09/20 2119

## 2020-06-09 NOTE — Discharge Instructions (Addendum)
Please discontinue decongestants and NSAIDs such as ibuprofen.  You may take Tylenol for headaches.  If any fevers above 101, chest pain or shortness of breath return to the ER.  Please quarantine for 10 days from symptom onset

## 2020-07-28 ENCOUNTER — Encounter: Payer: Self-pay | Admitting: Obstetrics & Gynecology

## 2020-07-28 ENCOUNTER — Other Ambulatory Visit: Payer: Self-pay

## 2020-07-28 ENCOUNTER — Ambulatory Visit (INDEPENDENT_AMBULATORY_CARE_PROVIDER_SITE_OTHER): Payer: Medicaid Other | Admitting: Obstetrics & Gynecology

## 2020-07-28 VITALS — BP 120/80 | Ht 64.0 in | Wt 150.0 lb

## 2020-07-28 DIAGNOSIS — Z3009 Encounter for other general counseling and advice on contraception: Secondary | ICD-10-CM | POA: Diagnosis not present

## 2020-07-28 DIAGNOSIS — T8339XA Other mechanical complication of intrauterine contraceptive device, initial encounter: Secondary | ICD-10-CM | POA: Diagnosis not present

## 2020-07-28 NOTE — Progress Notes (Signed)
  Contraception Counseling Patient is a G3P3 WF, s/p CS x3 with youngest 4 years ago, presents for contraception counseling. The patient has no complaints today. The patient is sexually active.  She has had a Mirena IUD for the last 4 years, and desires more permanent sterilization.  This is her second IUD (had one in between prior pregnancies) and she has had problems with both trying to remove them, as the strings are lost and it is difficult for the provider to remove.  She does well w periods (none) and IUDs, but does not want another one.   PMHx: She  has no past medical history on file. Also,  has a past surgical history that includes Cesarean section., family history is negative for female cancers or concerns,  reports that she has never smoked. She has never used smokeless tobacco. She reports that she does not drink alcohol and does not use drugs.  She has a current medication list which includes the following prescription(s): hydrochlorothiazide, hydrocodone-acetaminophen, hysingla er, levetiracetam, meloxicam, and tizanidine. Also, has No Known Allergies.  Review of Systems  Constitutional: Negative for chills, fever and malaise/fatigue.  HENT: Negative for congestion, sinus pain and sore throat.   Eyes: Negative for blurred vision and pain.  Respiratory: Negative for cough and wheezing.   Cardiovascular: Negative for chest pain and leg swelling.  Gastrointestinal: Negative for abdominal pain, constipation, diarrhea, heartburn, nausea and vomiting.  Genitourinary: Negative for dysuria, frequency, hematuria and urgency.  Musculoskeletal: Negative for back pain, joint pain, myalgias and neck pain.  Skin: Negative for itching and rash.  Neurological: Negative for dizziness, tremors and weakness.  Endo/Heme/Allergies: Does not bruise/bleed easily.  Psychiatric/Behavioral: Negative for depression. The patient is not nervous/anxious and does not have insomnia.     Objective: BP 120/80    Ht 5\' 4"  (1.626 m)   Wt 150 lb (68 kg)   BMI 25.75 kg/m  Physical Exam Constitutional:      General: She is not in acute distress.    Appearance: She is well-developed.  Musculoskeletal:        General: Normal range of motion.  Neurological:     Mental Status: She is alert and oriented to person, place, and time.  Skin:    General: Skin is warm and dry.  Vitals reviewed.     ASSESSMENT/PLAN:    Problem List Items Addressed This Visit   Visit Diagnoses    Sterilization consult    -  Primary   Retained intrauterine contraceptive device (IUD)        Birth Control I discussed multiple birth control options and methods with the patient.  The risks and benefits of each were reviewed.  The possible side effects including deep venous thrombosis, breast tenderness, fluid retention, mood changes and abnormal vaginal bleeding were discussed.  Combination as well as progesterone-only options, pros and cons counseled.  Plan Lap BTL.  WIll plan IUD removal at that time, as strings are not visible on exam and this would be less traumatic to patient.  Counseled that periods will resume after comes off progesterone hormones, and will monitor over time.  Consent for tubal (state form) counseled and signed today.  Will schedule after 30 days.  PAP UTD w PCP.  Records reviewed from their office.  , MD, Annamarie Major Ob/Gyn, [facility] 07/28/2020  3:13 PM

## 2020-07-28 NOTE — Patient Instructions (Signed)
Surgery to Prevent Pregnancy Female sterilization is surgery to prevent pregnancy. In this surgery, the fallopian tubes are either blocked or closed off. When the fallopian tubes are closed, the eggs that the ovaries release cannot enter the uterus, sperm cannot reach the eggs, and you cannot get pregnant. Sterilization is permanent. It should only be done if you are sure that you do not want to be able to have children. What are the sterilization surgery options? There are several kinds of female sterilization surgeries. They include:  Laparoscopic tubal ligation. In this surgery, the fallopian tubes are tied off, sealed with heat, or blocked with a clip, ring, or clamp. A small portion of each fallopian tube may also be removed. This surgery is done through several small cuts (incisions) with special instruments that are inserted into your abdomen.  Postpartum tubal ligation. This is also called a mini-laparotomy. This surgery is done right after childbirth or 1 or 2 days after childbirth. In this surgery, the fallopian tubes are tied off, sealed with heat, or blocked with a clip, ring, or clamp. A small portion of each fallopian tube may also be removed. The surgery is done through a single incision in the abdomen.  Tubal ligation during a C-section. In this surgery, the fallopian tubes are tied off, sealed with heat, or blocked with a clip, ring, or clamp. A small portion of each fallopian tube may also be removed. The surgery is done at the same time as a C-section delivery. Is sterilization safe? Generally, sterilization is safe. Complications are rare. However, there are risks. They include:  Bleeding.  Infection.  Reaction to medicine used during the procedure.  Injury to surrounding organs.  Failure of the procedure. How effective is sterilization? Sterilization is nearly 100% effective, but it can fail. In rare cases, the fallopian tubes can grow back together over time. If this  happens, pregnancy may be possible and you will be able to get pregnant again. Women who have had this procedure have a higher chance of having an ectopic pregnancy. An ectopic pregnancy is a pregnancy that happens outside of the uterus. This kind of pregnancy can lead to serious bleeding if it is not treated. What are the benefits?  It is usually effective for a lifetime.  It is usually safe.  It does not have the drawbacks of other types of birth control in that your hormones are not affected. Because of this, your menstrual periods, sexual desire, and sexual performance will not be affected. What are the drawbacks?  You will need to recover and may have complications after surgery.  If you change your mind and decide that you want to have children, you may not be able to. Sterilization may be reversed, but a reversal is not always successful.  It does not provide protection against STDs (sexually transmitted diseases).  It increases the chance of having an ectopic pregnancy. Follow these instructions at home:  Keep all follow-up visits as told by your health care provider. This is important. Summary  Female sterilization is surgery to prevent pregnancy.  There are different types of female sterilization surgeries.  Sterilization may be reversed, but a reversal is not always successful.  Sterilization does not protect against STDs. This information is not intended to replace advice given to you by your health care provider. Make sure you discuss any questions you have with your health care provider. Document Revised: 03/03/2019 Document Reviewed: 05/29/2018 Elsevier Patient Education  2020 Elsevier Inc.  

## 2020-08-15 ENCOUNTER — Telehealth: Payer: Self-pay | Admitting: Obstetrics & Gynecology

## 2020-08-15 NOTE — Telephone Encounter (Signed)
Called patient to schedule Lap Tubal partial salpingectomy, IUD removal w [facility]  DOS 11/30  H&P 11/22 @ 11:20   Covid testing 11/29 @ 8-10:30, [address], drive up and wear mask. Advised pt to quarantine until DOS.  Pre-admit phone call appointment to be requested - date and time will be included on H&P paper work. Also all appointments will be updated on pt MyChart. Explained that this appointment has a call window. Based on the time scheduled will indicate if the call will be received within a 4 hour window before 1:00 or after.  Advised that pt may also receive calls from the hospital pharmacy and pre-service center.  Confirmed pt has [employer] as primary insurance. No secondary insurance.

## 2020-08-15 NOTE — Telephone Encounter (Signed)
-----   Message from Nadara Mustard, MD sent at 08/03/2020  5:41 PM EDT ----- Regarding: RE: SURGERY Best option is for me to do surgery 11/30 since I'm already blocking time out to be there. ----- Message ----- From: Letta Pate Sent: 08/03/2020   2:02 PM EDT To: Nadara Mustard, MD Subject: RE: SURGERY                                    Patient is OON with her choice of Medicaid. However, Harriett Sine said the new extended deadline for accepting OON pts is 08/29/20. The first day she is elligable to have the surgery would be Monday, 11/29 (which obviously would not work). In order for [place] to get paid to do her surgery, it would have to be done on 11/30. This is also the day that I have blocked you afternoon in clinic for the combo surgery with Dr Michaell Cowing.  There are 4 options (sort of): 1 - lunchtime surgery on Monday, 11/29 2 - afternoon surgery on Tuesday, 11/30 - before or after your combo surgery w/ Gross 3 - See if [facility] (on call) would do surgery after the already scheduled surgeries 4 - Adv pt she will either have to change her Medicaid plan or we refer her to a practice that accepts her plan  Please adv. ----- Message ----- From: Nadara Mustard, MD Sent: 07/28/2020   3:10 PM EDT To: April Payton Mccallum Subject: SURGERY                                        TUBAL PAPERS SIGNED TODAY  Surgery Booking Request Patient Full Name:  Kathleen Joseph  MRN: [id number]  DOB: [birth year]  Surgeon: Letitia Libra, MD  Requested Surgery Date and Time: >30 days from today Primary Diagnosis AND Code: Sterilization  Secondary Diagnosis and Code:  Surgical Procedure: Laparoscopy with Tubal Partial Salingectomy, Also, Removal of IUD RNFA Requested?: No L&D Notification: No Admission Status: same day surgery Length of Surgery: 50 min Special Case Needs: No H&P: Yes Phone Interview???:  Yes Interpreter: No Medical Clearance:  No Special Scheduling Instructions: No Any known health/anesthesia  issues, diabetes, sleep apnea, latex allergy, defibrillator/pacemaker?: No Acuity: P3   (P1 highest, P2 delay may cause harm, P3 low, elective gyn, P4 lowest)

## 2020-08-21 ENCOUNTER — Encounter: Payer: Self-pay | Admitting: Obstetrics & Gynecology

## 2020-08-21 ENCOUNTER — Other Ambulatory Visit: Payer: Self-pay

## 2020-08-21 ENCOUNTER — Ambulatory Visit (INDEPENDENT_AMBULATORY_CARE_PROVIDER_SITE_OTHER): Payer: Medicaid Other | Admitting: Obstetrics & Gynecology

## 2020-08-21 VITALS — BP 100/70 | Ht 64.0 in | Wt 152.0 lb

## 2020-08-21 DIAGNOSIS — Z3009 Encounter for other general counseling and advice on contraception: Secondary | ICD-10-CM

## 2020-08-21 DIAGNOSIS — T8339XA Other mechanical complication of intrauterine contraceptive device, initial encounter: Secondary | ICD-10-CM

## 2020-08-21 NOTE — H&P (View-Only) (Signed)
   PRE-OPERATIVE HISTORY AND PHYSICAL EXAM  HPI:  Kathleen Joseph is a 34 y.o. [id number] No LMP recorded.; she is being admitted for surgery related to requested sterilization. 3 prior CS, no desire for future pregnancies again.  PMHx: History reviewed. No pertinent past medical history. Past Surgical History:  Procedure Laterality Date  . CESAREAN SECTION     History reviewed. No pertinent family history. Social History   Tobacco Use  . Smoking status: Never Smoker  . Smokeless tobacco: Never Used  Vaping Use  . Vaping Use: Never used  Substance Use Topics  . Alcohol use: Never  . Drug use: Never    Current Outpatient Medications:  .  hydrochlorothiazide (MICROZIDE) 12.5 MG capsule, Take 12.5 mg by mouth daily., Disp: , Rfl:  .  HYDROcodone-acetaminophen (NORCO) 7.5-325 MG tablet, Take 1 tablet by mouth 4 (four) times daily as needed for moderate pain. , Disp: , Rfl:  .  HYSINGLA ER 20 MG T24A, Take 20 mg by mouth daily. , Disp: , Rfl:  .  levETIRAcetam (KEPPRA) 250 MG tablet, Take 250 mg by mouth 2 (two) times daily. , Disp: , Rfl:  .  levonorgestrel (MIRENA) 20 MCG/24HR IUD, 1 each by Intrauterine route once., Disp: , Rfl:  .  meloxicam (MOBIC) 15 MG tablet, Take 15 mg by mouth daily., Disp: , Rfl:  .  Multiple Vitamin (MULTIVITAMIN WITH MINERALS) TABS tablet, Take 1 tablet by mouth daily., Disp: , Rfl:  .  tiZANidine (ZANAFLEX) 4 MG tablet, Take 4 mg by mouth 2 (two) times daily as needed for muscle spasms. , Disp: , Rfl:  Allergies: Patient has no known allergies.  Review of Systems  Constitutional: Negative for chills, fever and malaise/fatigue.  HENT: Negative for congestion, sinus pain and sore throat.   Eyes: Negative for blurred vision and pain.  Respiratory: Negative for cough and wheezing.   Cardiovascular: Negative for chest pain and leg swelling.  Gastrointestinal: Negative for abdominal pain, constipation, diarrhea, heartburn, nausea and vomiting.    Genitourinary: Negative for dysuria, frequency, hematuria and urgency.  Musculoskeletal: Negative for back pain, joint pain, myalgias and neck pain.  Skin: Negative for itching and rash.  Neurological: Negative for dizziness, tremors and weakness.  Endo/Heme/Allergies: Does not bruise/bleed easily.  Psychiatric/Behavioral: Negative for depression. The patient is not nervous/anxious and does not have insomnia.     Objective: BP 100/70   Ht 5' 4" (1.626 m)   Wt 152 lb (68.9 kg)   BMI 26.09 kg/m   Filed Weights   08/21/20 1121  Weight: 152 lb (68.9 kg)   Physical Exam Constitutional:      General: She is not in acute distress.    Appearance: She is well-developed.  HENT:     Head: Normocephalic and atraumatic. No laceration.     Right Ear: Hearing normal.     Left Ear: Hearing normal.     Mouth/Throat:     Pharynx: Uvula midline.  Eyes:     Pupils: Pupils are equal, round, and reactive to light.  Neck:     Thyroid: No thyromegaly.  Cardiovascular:     Rate and Rhythm: Normal rate and regular rhythm.     Heart sounds: No murmur heard.  No friction rub. No gallop.   Pulmonary:     Effort: Pulmonary effort is normal. No respiratory distress.     Breath sounds: Normal breath sounds. No wheezing.  Chest:     Breasts:          Right: No mass, skin change or tenderness.        Left: No mass, skin change or tenderness.  Abdominal:     General: Bowel sounds are normal. There is no distension.     Palpations: Abdomen is soft.     Tenderness: There is no abdominal tenderness. There is no rebound.  Musculoskeletal:        General: Normal range of motion.     Cervical back: Normal range of motion and neck supple.  Neurological:     Mental Status: She is alert and oriented to person, place, and time.     Cranial Nerves: No cranial nerve deficit.  Skin:    General: Skin is warm and dry.  Psychiatric:        Judgment: Judgment normal.  Vitals reviewed.     Assessment: 1.  Sterilization consult   2. Retained intrauterine contraceptive device (IUD)   Remove IUD and partial salpingectomy planned  The patient has been fully informed about all methods of contraception, both temporary and permanent. She understands that tubal ligation is meant to be permanent, absolute and irreversible. She was told that there is an approximately 1 in 400 chance of a pregnancy in the future after tubal ligation. She was told the short and long term complications of tubal ligation. She understands the risks from this surgery include, but are not limited to, the risks of anesthesia, hemorrhage, infection, perforation, and injury to adjacent structures, bowel, bladder and blood vessels.   Annamarie Major, MD, [facility], [facility] 08/21/2020  11:55 AM

## 2020-08-21 NOTE — Progress Notes (Signed)
PRE-OPERATIVE HISTORY AND PHYSICAL EXAM  HPI:  Kathleen Joseph is a 34 y.o. [phone] No LMP recorded.; she is being admitted for surgery related to requested sterilization. 3 prior CS, no desire for future pregnancies again.  PMHx: History reviewed. No pertinent past medical history. Past Surgical History:  Procedure Laterality Date  . CESAREAN SECTION     History reviewed. No pertinent family history. Social History   Tobacco Use  . Smoking status: Never Smoker  . Smokeless tobacco: Never Used  Vaping Use  . Vaping Use: Never used  Substance Use Topics  . Alcohol use: Never  . Drug use: Never    Current Outpatient Medications:  .  hydrochlorothiazide (MICROZIDE) 12.5 MG capsule, Take 12.5 mg by mouth daily., Disp: , Rfl:  .  HYDROcodone-acetaminophen (NORCO) 7.5-325 MG tablet, Take 1 tablet by mouth 4 (four) times daily as needed for moderate pain. , Disp: , Rfl:  .  HYSINGLA ER 20 MG T24A, Take 20 mg by mouth daily. , Disp: , Rfl:  .  levETIRAcetam (KEPPRA) 250 MG tablet, Take 250 mg by mouth 2 (two) times daily. , Disp: , Rfl:  .  levonorgestrel (MIRENA) 20 MCG/24HR IUD, 1 each by Intrauterine route once., Disp: , Rfl:  .  meloxicam (MOBIC) 15 MG tablet, Take 15 mg by mouth daily., Disp: , Rfl:  .  Multiple Vitamin (MULTIVITAMIN WITH MINERALS) TABS tablet, Take 1 tablet by mouth daily., Disp: , Rfl:  .  tiZANidine (ZANAFLEX) 4 MG tablet, Take 4 mg by mouth 2 (two) times daily as needed for muscle spasms. , Disp: , Rfl:  Allergies: Patient has no known allergies.  Review of Systems  Constitutional: Negative for chills, fever and malaise/fatigue.  HENT: Negative for congestion, sinus pain and sore throat.   Eyes: Negative for blurred vision and pain.  Respiratory: Negative for cough and wheezing.   Cardiovascular: Negative for chest pain and leg swelling.  Gastrointestinal: Negative for abdominal pain, constipation, diarrhea, heartburn, nausea and vomiting.    Genitourinary: Negative for dysuria, frequency, hematuria and urgency.  Musculoskeletal: Negative for back pain, joint pain, myalgias and neck pain.  Skin: Negative for itching and rash.  Neurological: Negative for dizziness, tremors and weakness.  Endo/Heme/Allergies: Does not bruise/bleed easily.  Psychiatric/Behavioral: Negative for depression. The patient is not nervous/anxious and does not have insomnia.     Objective: BP 100/70   Ht 5\' 4"  (1.626 m)   Wt 152 lb (68.9 kg)   BMI 26.09 kg/m   Filed Weights   08/21/20 1121  Weight: 152 lb (68.9 kg)   Physical Exam Constitutional:      General: She is not in acute distress.    Appearance: She is well-developed.  HENT:     Head: Normocephalic and atraumatic. No laceration.     Right Ear: Hearing normal.     Left Ear: Hearing normal.     Mouth/Throat:     Pharynx: Uvula midline.  Eyes:     Pupils: Pupils are equal, round, and reactive to light.  Neck:     Thyroid: No thyromegaly.  Cardiovascular:     Rate and Rhythm: Normal rate and regular rhythm.     Heart sounds: No murmur heard.  No friction rub. No gallop.   Pulmonary:     Effort: Pulmonary effort is normal. No respiratory distress.     Breath sounds: Normal breath sounds. No wheezing.  Chest:     Breasts:  Right: No mass, skin change or tenderness.        Left: No mass, skin change or tenderness.  Abdominal:     General: Bowel sounds are normal. There is no distension.     Palpations: Abdomen is soft.     Tenderness: There is no abdominal tenderness. There is no rebound.  Musculoskeletal:        General: Normal range of motion.     Cervical back: Normal range of motion and neck supple.  Neurological:     Mental Status: She is alert and oriented to person, place, and time.     Cranial Nerves: No cranial nerve deficit.  Skin:    General: Skin is warm and dry.  Psychiatric:        Judgment: Judgment normal.  Vitals reviewed.     Assessment: 1.  Sterilization consult   2. Retained intrauterine contraceptive device (IUD)   Remove IUD and partial salpingectomy planned  The patient has been fully informed about all methods of contraception, both temporary and permanent. She understands that tubal ligation is meant to be permanent, absolute and irreversible. She was told that there is an approximately 1 in 400 chance of a pregnancy in the future after tubal ligation. She was told the short and long term complications of tubal ligation. She understands the risks from this surgery include, but are not limited to, the risks of anesthesia, hemorrhage, infection, perforation, and injury to adjacent structures, bowel, bladder and blood vessels.   Annamarie Major, MD, [facility], [facility] 08/21/2020  11:55 AM

## 2020-08-21 NOTE — Patient Instructions (Signed)
PRE ADMISSION TESTING For Covid, prior to procedure Monday 9:00-10:00 [facility] entrance (drive up)  Results in 38-25 hours You will not receive notification if test results are negative. If positive for Covid19, your provider will notify you by phone, with additional instructions.   Diagnostic Laparoscopy, Care After This sheet gives you information about how to care for yourself after your procedure. Your health care provider may also give you more specific instructions. If you have problems or questions, contact your health care provider. What can I expect after the procedure? After the procedure, it is common to have:  Mild discomfort in the abdomen.  Sore throat. Women who have laparoscopy with pelvic examination may have mild cramping and fluid coming from the vagina for a few days after the procedure. Follow these instructions at home: Medicines  Take over-the-counter and prescription medicines only as told by your health care provider.  If you were prescribed an antibiotic medicine, take it as told by your health care provider. Do not stop taking the antibiotic even if you start to feel better. Driving  Do not drive for 24 hours if you were given a medicine to help you relax (sedative) during your procedure.  Do not drive or use heavy machinery while taking prescription pain medicine. Bathing  Do not take baths, swim, or use a hot tub until your health care provider approves. You may take showers. Incision care   Follow instructions from your health care provider about how to take care of your incisions. Make sure you: ? Wash your hands with soap and water before you change your bandage (dressing). If soap and water are not available, use hand sanitizer. ? Change your dressing as told by your health care provider. ? Leave stitches (sutures), skin glue, or adhesive strips in place. These skin closures may need to stay in place for 2 weeks or longer. If  adhesive strip edges start to loosen and curl up, you may trim the loose edges. Do not remove adhesive strips completely unless your health care provider tells you to do that.  Check your incision areas every day for signs of infection. Check for: ? Redness, swelling, or pain. ? Fluid or blood. ? Warmth. ? Pus or a bad smell. Activity  Return to your normal activities as told by your health care provider. Ask your health care provider what activities are safe for you.  Do not lift anything that is heavier than 10 lb (4.5 kg), or the limit that you are told, until your health care provider says that it is safe. General instructions  To prevent or treat constipation while you are taking prescription pain medicine, your health care provider may recommend that you: ? Drink enough fluid to keep your urine pale yellow. ? Take over-the-counter or prescription medicines. ? Eat foods that are high in fiber, such as fresh fruits and vegetables, whole grains, and beans. ? Limit foods that are high in fat and processed sugars, such as fried and sweet foods.  Do not use any products that contain nicotine or tobacco, such as cigarettes and e-cigarettes. If you need help quitting, ask your health care provider.  Keep all follow-up visits as told by your health care provider. This is important. Contact a health care provider if:  You develop shoulder pain.  You feel lightheaded or faint.  You are unable to pass gas or have a bowel movement.  You feel nauseous or you vomit.  You develop a rash.  You  have redness, swelling, or pain around any incision.  You have fluid or blood coming from any incision.  Any incision feels warm to the touch.  You have pus or a bad smell coming from any incision.  You have a fever or chills. Get help right away if:  You have severe pain.  You have vomiting that does not go away.  You have heavy bleeding from the vagina.  Any incision opens.  You  have trouble breathing.  You have chest pain. Summary  After the procedure, it is common to have mild discomfort in the abdomen and a sore throat.  Check your incision areas every day for signs of infection.  Return to your normal activities as told by your health care provider. Ask your health care provider what activities are safe for you. This information is not intended to replace advice given to you by your health care provider. Make sure you discuss any questions you have with your health care provider. Document Revised: 08/29/2017 Document Reviewed: 03/12/2017 Elsevier Patient Education  [place].

## 2020-08-22 ENCOUNTER — Other Ambulatory Visit: Payer: Medicaid Other

## 2020-08-22 ENCOUNTER — Encounter
Admission: RE | Admit: 2020-08-22 | Discharge: 2020-08-22 | Disposition: A | Discharge status: Home / Self Care | Payer: Medicaid Other | Source: Ambulatory Visit | Attending: Obstetrics & Gynecology | Admitting: Obstetrics & Gynecology

## 2020-08-22 ENCOUNTER — Other Ambulatory Visit: Payer: Self-pay

## 2020-08-22 DIAGNOSIS — I10 Essential (primary) hypertension: Secondary | ICD-10-CM | POA: Insufficient documentation

## 2020-08-22 DIAGNOSIS — Z01818 Encounter for other preprocedural examination: Secondary | ICD-10-CM | POA: Insufficient documentation

## 2020-08-22 HISTORY — DX: Myoneural disorder, unspecified: G70.9

## 2020-08-22 HISTORY — DX: Essential (primary) hypertension: I10

## 2020-08-22 HISTORY — DX: Gastro-esophageal reflux disease without esophagitis: K21.9

## 2020-08-22 HISTORY — DX: Headache, unspecified: R51.9

## 2020-08-22 NOTE — Patient Instructions (Signed)
Your procedure is scheduled on: Tuesday 08/29/20.  Report to THE FIRST FLOOR REGISTRATION DESK IN THE MEDICAL MALL ON THE MORNING OF SURGERY FIRST, THEN YOU WILL CHECK IN AT THE SURGERY INFORMATION DESK LOCATED OUTSIDE THE SAME DAY SURGERY DEPARTMENT LOCATED ON 2ND FLOOR MEDICAL MALL ENTRANCE.  To find out your arrival time please call [phone] between 1PM - 3PM on Monday 08/28/20.   Remember: Instructions that are not followed completely may result in serious medical risk, up to and including death, or upon the discretion of your surgeon and anesthesiologist your surgery may need to be rescheduled.     __X__ 1. Do not eat food after midnight the night before your procedure.                 No gum chewing or hard candies. You may drink clear liquids up to 2 hours                 before you are scheduled to arrive for your surgery- DO NOT drink clear                 liquids within 2 hours of the start of your surgery.                 Clear Liquids include:  water, apple juice without pulp, clear carbohydrate                 drink such as Clearfast or Gatorade, Black Coffee or Tea (Do not add                 milk or creamer to coffee or tea).  __X__2.  On the morning of surgery brush your teeth with toothpaste and water, you may rinse your mouth with mouthwash if you wish.  Do not swallow any toothpaste or mouthwash.    __X__ 3.  No Alcohol for 24 hours before or after surgery.  __X__ 4.  Do Not Smoke or use e-cigarettes For 24 Hours Prior to Your Surgery.                 Do not use any chewable tobacco products for at least 6 hours prior to                 surgery.  __X__5.  Notify your doctor if there is any change in your medical condition      (cold, fever, infections).      Do NOT wear jewelry, make-up, hairpins, clips or nail polish. Do NOT wear lotions, powders, or perfumes.  Do NOT shave 48 hours prior to surgery. Men may shave face and neck. Do NOT bring valuables to the  hospital.     [facility] is not responsible for any belongings or valuables.   Contacts, dentures/partials or body piercings may not be worn into surgery. Bring a case for your contacts, glasses or hearing aids, a denture cup will be supplied.   Patients discharged the day of surgery will not be allowed to drive home.     __X__ Take these medicines the morning of surgery with A SIP OF WATER:     1. levETIRAcetam (KEPPRA)  2. HYDROcodone-acetaminophen (NORCO) if needed  3. tiZANidine (ZANAFLEX) if needed      __X__ Use CHG Soap as directed  __X__ Use inhalers on the day of surgery. Also bring the inhaler with you to the hospital on the morning of surgery.  __X__ Stop Anti-inflammatories 7 days before  surgery such as Advil, Ibuprofen, Motrin, BC or Goodies Powder, Naprosyn, Naproxen, Aleve, Aspirin, Meloxicam. May take Tylenol if needed for pain or discomfort.   __X__Do not start taking any new herbal supplements or vitamins prior to your procedure.     Wear comfortable clothing (specific to your surgery type) to the hospital.  Plan for stool softeners for home use; pain medications have a tendency to cause constipation. You can also help prevent constipation by eating foods high in fiber such as fruits and vegetables and drinking plenty of fluids as your diet allows.  After surgery, you can prevent lung complications by doing breathing exercises.Take deep breaths and cough every 1-2 hours. Your doctor may order a device called an Incentive Spirometer to help you take deep breaths.  Please call the [place] Department at [phone] if you have any questions about these instructions.

## 2020-08-22 NOTE — Pre-Procedure Instructions (Signed)
Patient does not need pre-surgery Covid testing. She was Covid positive on 06/09/20.  Contains abnormal dataLumiraDX SARS-COV-2 Rapid Result Antigen Test Specimen:  Other  Ref Range & Units 2 mo ago  LumiraDX SARS-COV-2 Rapid Result Antigen Test Negative, Invalid  PositiveAbnormal      INTERNAL CONTROLS VALID  Yes--Test working appropriately      Expiration Date  9282021      Lot Number  [id number]      Test Brand Name_Covid-19  Lumiradx Sars-Cov-2 AG Test      Resulting Agency  [facility] [id number]  Specimen Collected: 06/09/20 5:58 PM Last Resulted: 06/09/20 5:58 PM  Received From: [facility]  Result Received: 06/09/20 7:17 PM

## 2020-08-28 ENCOUNTER — Encounter
Admission: RE | Admit: 2020-08-28 | Discharge: 2020-08-28 | Disposition: A | Discharge status: Home / Self Care | Payer: Medicaid Other | Source: Ambulatory Visit | Attending: Obstetrics & Gynecology | Admitting: Obstetrics & Gynecology

## 2020-08-28 ENCOUNTER — Other Ambulatory Visit: Payer: Self-pay

## 2020-08-28 ENCOUNTER — Other Ambulatory Visit: Payer: Medicaid Other

## 2020-08-28 DIAGNOSIS — Z01810 Encounter for preprocedural cardiovascular examination: Secondary | ICD-10-CM

## 2020-08-28 DIAGNOSIS — Z01818 Encounter for other preprocedural examination: Secondary | ICD-10-CM | POA: Diagnosis not present

## 2020-08-28 DIAGNOSIS — I10 Essential (primary) hypertension: Secondary | ICD-10-CM | POA: Diagnosis not present

## 2020-08-28 LAB — BASIC METABOLIC PANEL
Anion gap: 10 (ref 5–15)
BUN: 11 mg/dL (ref 6–20)
CO2: 26 mmol/L (ref 22–32)
Calcium: 9.5 mg/dL (ref 8.9–10.3)
Chloride: 100 mmol/L (ref 98–111)
Creatinine, Ser: 0.51 mg/dL (ref 0.44–1.00)
GFR, Estimated: >60 mL/min (ref >60)
Glucose, Bld: 124 mg/dL — ABNORMAL HIGH (ref 70–99)
Potassium: 3.2 mmol/L — ABNORMAL LOW (ref 3.5–5.1)
Sodium: 136 mmol/L (ref 135–145)

## 2020-08-28 LAB — CBC
HCT: 41.1 % (ref 36.0–46.0)
Hemoglobin: 14.8 g/dL (ref 12.0–15.0)
MCH: 32 pg (ref 26.0–34.0)
MCHC: 36 g/dL (ref 30.0–36.0)
MCV: 88.8 fL (ref 80.0–100.0)
Platelets: 185 10*3/uL (ref 150–400)
RBC: 4.63 MIL/uL (ref 3.87–5.11)
RDW: 11.4 % — ABNORMAL LOW (ref 11.5–15.5)
WBC: 6.9 10*3/uL (ref 4.0–10.5)
nRBC: 0 % (ref 0.0–0.2)

## 2020-08-28 LAB — TYPE AND SCREEN
ABO/RH(D): O POS
Antibody Screen: NEGATIVE

## 2020-08-28 MED ORDER — LACTATED RINGERS IV SOLN: INTRAVENOUS | Status: DC

## 2020-08-28 MED ORDER — ORAL CARE MOUTH RINSE: 15.0000 mL | Freq: Once | OROMUCOSAL | Status: AC

## 2020-08-28 MED ORDER — CHLORHEXIDINE GLUCONATE 0.12 % MT SOLN: 15.0000 mL | Freq: Once | OROMUCOSAL | Status: AC

## 2020-08-28 MED ORDER — POVIDONE-IODINE 10 % EX SWAB: 2.0000 "application " | Freq: Once | CUTANEOUS | Status: DC

## 2020-08-29 ENCOUNTER — Ambulatory Visit: Payer: Medicaid Other

## 2020-08-29 ENCOUNTER — Encounter: Payer: Self-pay | Admitting: Obstetrics & Gynecology

## 2020-08-29 ENCOUNTER — Other Ambulatory Visit: Payer: Self-pay

## 2020-08-29 ENCOUNTER — Ambulatory Visit: Payer: Medicaid Other | Admitting: Registered Nurse

## 2020-08-29 ENCOUNTER — Ambulatory Visit
Admission: RE | Admit: 2020-08-29 | Discharge: 2020-08-29 | Disposition: A | Discharge status: Home / Self Care | Payer: Medicaid Other | Attending: Obstetrics & Gynecology | Admitting: Obstetrics & Gynecology

## 2020-08-29 ENCOUNTER — Encounter
Admission: RE | Disposition: A | Discharge status: Home / Self Care | Payer: Self-pay | Source: Home / Self Care | Attending: Obstetrics & Gynecology

## 2020-08-29 DIAGNOSIS — Z79899 Other long term (current) drug therapy: Secondary | ICD-10-CM | POA: Insufficient documentation

## 2020-08-29 DIAGNOSIS — Z302 Encounter for sterilization: Secondary | ICD-10-CM | POA: Diagnosis present

## 2020-08-29 DIAGNOSIS — N838 Other noninflammatory disorders of ovary, fallopian tube and broad ligament: Secondary | ICD-10-CM | POA: Insufficient documentation

## 2020-08-29 DIAGNOSIS — K219 Gastro-esophageal reflux disease without esophagitis: Secondary | ICD-10-CM | POA: Insufficient documentation

## 2020-08-29 DIAGNOSIS — I10 Essential (primary) hypertension: Secondary | ICD-10-CM | POA: Diagnosis not present

## 2020-08-29 DIAGNOSIS — T8332XA Displacement of intrauterine contraceptive device, initial encounter: Secondary | ICD-10-CM

## 2020-08-29 HISTORY — PX: LAPAROSCOPIC TUBAL LIGATION: SHX1937

## 2020-08-29 LAB — ABO/RH: ABO/RH(D): O POS

## 2020-08-29 SURGERY — LIGATION, FALLOPIAN TUBE, LAPAROSCOPIC
Anesthesia: General

## 2020-08-29 MED ORDER — FENTANYL CITRATE (PF) 100 MCG/2ML IJ SOLN
25.0000 ug | INTRAMUSCULAR | Status: DC | PRN
  Administered 2020-08-29 (×6): 25 ug via INTRAVENOUS

## 2020-08-29 MED ORDER — MIDAZOLAM HCL 2 MG/2ML IJ SOLN
INTRAMUSCULAR | Status: DC | PRN
  Administered 2020-08-29: 2 mg via INTRAVENOUS

## 2020-08-29 MED ORDER — FENTANYL CITRATE (PF) 100 MCG/2ML IJ SOLN
INTRAMUSCULAR | Status: AC
  Filled 2020-08-29: qty 2

## 2020-08-29 MED ORDER — ARTIFICIAL TEARS OPHTHALMIC OINT
TOPICAL_OINTMENT | OPHTHALMIC | Status: AC
  Filled 2020-08-29: qty 3.5

## 2020-08-29 MED ORDER — BUPIVACAINE HCL (PF) 0.5 % IJ SOLN
INTRAMUSCULAR | Status: AC
  Filled 2020-08-29: qty 30

## 2020-08-29 MED ORDER — HYDROCODONE-ACETAMINOPHEN 5-325 MG PO TABS: 1.0000 | ORAL_TABLET | ORAL | Status: DC | PRN

## 2020-08-29 MED ORDER — LACTATED RINGERS IV SOLN: INTRAVENOUS | Status: DC

## 2020-08-29 MED ORDER — LIDOCAINE HCL (CARDIAC) PF 100 MG/5ML IV SOSY
PREFILLED_SYRINGE | INTRAVENOUS | Status: DC | PRN
  Administered 2020-08-29: 80 mg via INTRATRACHEAL

## 2020-08-29 MED ORDER — BUPIVACAINE HCL 0.5 % IJ SOLN
INTRAMUSCULAR | Status: DC | PRN
  Administered 2020-08-29: 10 mL

## 2020-08-29 MED ORDER — SUGAMMADEX SODIUM 500 MG/5ML IV SOLN
INTRAVENOUS | Status: AC
  Filled 2020-08-29: qty 10

## 2020-08-29 MED ORDER — MORPHINE SULFATE (PF) 2 MG/ML IV SOLN: 1.0000 mg | INTRAVENOUS | Status: DC | PRN

## 2020-08-29 MED ORDER — ONDANSETRON HCL 4 MG/2ML IJ SOLN: 4.0000 mg | Freq: Once | INTRAMUSCULAR | Status: DC | PRN

## 2020-08-29 MED ORDER — HYDROCODONE-ACETAMINOPHEN 5-325 MG PO TABS: 1.0000 | ORAL_TABLET | ORAL | 0 refills | Status: DC | PRN

## 2020-08-29 MED ORDER — SUGAMMADEX SODIUM 200 MG/2ML IV SOLN
INTRAVENOUS | Status: DC | PRN
  Administered 2020-08-29: 200 mg via INTRAVENOUS

## 2020-08-29 MED ORDER — ONDANSETRON HCL 4 MG/2ML IJ SOLN
INTRAMUSCULAR | Status: AC
  Filled 2020-08-29: qty 2

## 2020-08-29 MED ORDER — DIPHENHYDRAMINE HCL 50 MG/ML IJ SOLN
INTRAMUSCULAR | Status: AC
  Filled 2020-08-29: qty 1

## 2020-08-29 MED ORDER — HYDROCODONE-ACETAMINOPHEN 5-325 MG PO TABS
ORAL_TABLET | ORAL | Status: AC
  Filled 2020-08-29: qty 1

## 2020-08-29 MED ORDER — ACETAMINOPHEN 325 MG PO TABS: 650.0000 mg | ORAL_TABLET | ORAL | Status: DC | PRN

## 2020-08-29 MED ORDER — CHLORHEXIDINE GLUCONATE 0.12 % MT SOLN
OROMUCOSAL | Status: AC
  Administered 2020-08-29: 15 mL via OROMUCOSAL
  Filled 2020-08-29: qty 15

## 2020-08-29 MED ORDER — PROPOFOL 10 MG/ML IV BOLUS
INTRAVENOUS | Status: AC
  Filled 2020-08-29: qty 20

## 2020-08-29 MED ORDER — DEXMEDETOMIDINE (PRECEDEX) IN NS 20 MCG/5ML (4 MCG/ML) IV SYRINGE
PREFILLED_SYRINGE | INTRAVENOUS | Status: AC
  Filled 2020-08-29: qty 5

## 2020-08-29 MED ORDER — EPHEDRINE 5 MG/ML INJ
INTRAVENOUS | Status: AC
  Filled 2020-08-29: qty 10

## 2020-08-29 MED ORDER — DEXAMETHASONE SODIUM PHOSPHATE 10 MG/ML IJ SOLN
INTRAMUSCULAR | Status: DC | PRN
  Administered 2020-08-29: 10 mg via INTRAVENOUS

## 2020-08-29 MED ORDER — ACETAMINOPHEN 650 MG RE SUPP
650.0000 mg | RECTAL | Status: DC | PRN
  Filled 2020-08-29: qty 1

## 2020-08-29 MED ORDER — ROCURONIUM BROMIDE 10 MG/ML (PF) SYRINGE
PREFILLED_SYRINGE | INTRAVENOUS | Status: AC
  Filled 2020-08-29: qty 10

## 2020-08-29 MED ORDER — ONDANSETRON HCL 4 MG/2ML IJ SOLN
INTRAMUSCULAR | Status: DC | PRN
Start: 2020-08-29 — End: 2020-08-29
  Administered 2020-08-29: 4 mg via INTRAVENOUS

## 2020-08-29 MED ORDER — FENTANYL CITRATE (PF) 100 MCG/2ML IJ SOLN
INTRAMUSCULAR | Status: DC | PRN
  Administered 2020-08-29 (×6): 50 ug via INTRAVENOUS

## 2020-08-29 MED ORDER — DEXMEDETOMIDINE (PRECEDEX) IN NS 20 MCG/5ML (4 MCG/ML) IV SYRINGE
PREFILLED_SYRINGE | INTRAVENOUS | Status: DC | PRN
Start: 2020-08-29 — End: 2020-08-29
  Administered 2020-08-29: 8 ug via INTRAVENOUS
  Administered 2020-08-29: 4 ug via INTRAVENOUS
  Administered 2020-08-29: 8 ug via INTRAVENOUS

## 2020-08-29 MED ORDER — KETOROLAC TROMETHAMINE 30 MG/ML IJ SOLN
INTRAMUSCULAR | Status: DC | PRN
  Administered 2020-08-29: 30 mg via INTRAVENOUS

## 2020-08-29 MED ORDER — HYDROCODONE-ACETAMINOPHEN 5-325 MG PO TABS
1.0000 | ORAL_TABLET | Freq: Once | ORAL | Status: AC
  Administered 2020-08-29: 1 via ORAL

## 2020-08-29 MED ORDER — PROPOFOL 10 MG/ML IV BOLUS
INTRAVENOUS | Status: DC | PRN
  Administered 2020-08-29: 50 mg via INTRAVENOUS
  Administered 2020-08-29: 200 mg via INTRAVENOUS

## 2020-08-29 MED ORDER — DEXAMETHASONE SODIUM PHOSPHATE 10 MG/ML IJ SOLN
INTRAMUSCULAR | Status: AC
  Filled 2020-08-29: qty 1

## 2020-08-29 MED ORDER — ROCURONIUM BROMIDE 100 MG/10ML IV SOLN
INTRAVENOUS | Status: DC | PRN
  Administered 2020-08-29: 60 mg via INTRAVENOUS

## 2020-08-29 MED ORDER — PROPOFOL 500 MG/50ML IV EMUL
INTRAVENOUS | Status: AC
  Filled 2020-08-29: qty 50

## 2020-08-29 MED ORDER — MIDAZOLAM HCL 2 MG/2ML IJ SOLN
INTRAMUSCULAR | Status: AC
  Filled 2020-08-29: qty 2

## 2020-08-29 SURGICAL SUPPLY — 42 items
ADH SKN CLS APL DERMABOND .7 (GAUZE/BANDAGES/DRESSINGS) ×2
APL PRP STRL LF DISP 70% ISPRP (MISCELLANEOUS) ×2
APL SWBSTK 6 STRL LF DISP (MISCELLANEOUS) ×10
APPLICATOR COTTON TIP 6 STRL (MISCELLANEOUS) ×10 IMPLANT
APPLICATOR COTTON TIP 6IN STRL (MISCELLANEOUS) ×20
BAG COUNTER SPONGE EZ (MISCELLANEOUS) ×3 IMPLANT
BAG SPNG 4X4 CLR HAZ (MISCELLANEOUS) ×2
BLADE SURG SZ11 CARB STEEL (BLADE) ×4 IMPLANT
CANISTER SUCT 1200ML W/VALVE (MISCELLANEOUS) ×4 IMPLANT
CATH ROBINSON RED A/P 16FR (CATHETERS) ×4 IMPLANT
CHLORAPREP W/TINT 26 (MISCELLANEOUS) ×4 IMPLANT
COUNTER SPONGE BAG EZ (MISCELLANEOUS) ×1
COVER WAND RF STERILE (DRAPES) IMPLANT
DERMABOND ADVANCED (GAUZE/BANDAGES/DRESSINGS) ×2
DERMABOND ADVANCED .7 DNX12 (GAUZE/BANDAGES/DRESSINGS) ×2 IMPLANT
DRSG TEGADERM 2-3/8X2-3/4 SM (GAUZE/BANDAGES/DRESSINGS) ×8 IMPLANT
GLOVE BIO SURGEON STRL SZ7.5 (GLOVE) ×4 IMPLANT
GLOVE BIO SURGEON STRL SZ8 (GLOVE) ×4 IMPLANT
GLOVE INDICATOR 8.0 STRL GRN (GLOVE) ×4 IMPLANT
GOWN STRL REUS W/ TWL LRG LVL3 (GOWN DISPOSABLE) ×2 IMPLANT
GOWN STRL REUS W/ TWL XL LVL3 (GOWN DISPOSABLE) ×2 IMPLANT
GOWN STRL REUS W/TWL LRG LVL3 (GOWN DISPOSABLE) ×4
GOWN STRL REUS W/TWL XL LVL3 (GOWN DISPOSABLE) ×4
LABEL OR SOLS (LABEL) ×4 IMPLANT
MANIFOLD NEPTUNE II (INSTRUMENTS) ×4 IMPLANT
NEEDLE VERESS 14GA 120MM (NEEDLE) ×4 IMPLANT
NS IRRIG 500ML POUR BTL (IV SOLUTION) ×4 IMPLANT
PACK DNC HYST (MISCELLANEOUS) ×4 IMPLANT
PACK GYN LAPAROSCOPIC (MISCELLANEOUS) ×4 IMPLANT
PAD OB MATERNITY 4.3X12.25 (PERSONAL CARE ITEMS) ×4 IMPLANT
PAD PREP 24X41 OB/GYN DISP (PERSONAL CARE ITEMS) ×4 IMPLANT
SET TUBE SMOKE EVAC HIGH FLOW (TUBING) ×4 IMPLANT
SHEARS HARMONIC ACE PLUS 36CM (ENDOMECHANICALS) IMPLANT
SPONGE GAUZE 2X2 8PLY STER LF (GAUZE/BANDAGES/DRESSINGS) ×2
SPONGE GAUZE 2X2 8PLY STRL LF (GAUZE/BANDAGES/DRESSINGS) ×6 IMPLANT
STRAP SAFETY 5IN WIDE (MISCELLANEOUS) ×4 IMPLANT
SUT VIC AB 2-0 UR6 27 (SUTURE) ×4 IMPLANT
SUT VIC AB 4-0 PS2 18 (SUTURE) ×8 IMPLANT
SYR 10ML LL (SYRINGE) ×8 IMPLANT
TOWEL OR 17X26 4PK STRL BLUE (TOWEL DISPOSABLE) ×4 IMPLANT
TROCAR ENDO BLADELESS 11MM (ENDOMECHANICALS) IMPLANT
TROCAR XCEL NON-BLD 5MMX100MML (ENDOMECHANICALS) ×4 IMPLANT

## 2020-08-29 NOTE — Discharge Instructions (Signed)
Laparoscopic Tubal Ligation, Care After This sheet gives you information about how to care for yourself after your procedure. Your health care provider may also give you more specific instructions. If you have problems or questions, contact your health care provider. What can I expect after the procedure? After the procedure, it is common to have:  A sore throat.  Discomfort in your shoulder.  Mild discomfort or cramping in your abdomen.  Gas pains.  Pain or soreness in the area where the surgical incision was made.  A bloated feeling.  Tiredness.  Nausea.  Vomiting. Follow these instructions at home: Medicines  Take over-the-counter and prescription medicines only as told by your health care provider.  Do not take aspirin because it can cause bleeding.  Ask your health care provider if the medicine prescribed to you: ? Requires you to avoid driving or using heavy machinery. ? Can cause constipation. You may need to take actions to prevent or treat constipation, such as:  Drink enough fluid to keep your urine pale yellow.  Take over-the-counter or prescription medicines.  Eat foods that are high in fiber, such as beans, whole grains, and fresh fruits and vegetables.  Limit foods that are high in fat and processed sugars, such as fried or sweet foods. Incision care      Follow instructions from your health care provider about how to take care of your incision. Make sure you: ? Wash your hands with soap and water before and after you change your bandage (dressing). If soap and water are not available, use hand sanitizer. ? Change your dressing as told by your health care provider. ? Leave stitches (sutures), skin glue, or adhesive strips in place. These skin closures may need to stay in place for 2 weeks or longer. If adhesive strip edges start to loosen and curl up, you may trim the loose edges. Do not remove adhesive strips completely unless your health care provider  tells you to do that.  Check your incision area every day for signs of infection. Check for: ? Redness, swelling, or pain. ? Fluid or blood. ? Warmth. ? Pus or a bad smell. Activity  Rest as told by your health care provider.  Avoid sitting for a long time without moving. Get up to take short walks every 1-2 hours. This is important to improve blood flow and breathing. Ask for help if you feel weak or unsteady.  Return to your normal activities as told by your health care provider. Ask your health care provider what activities are safe for you. General instructions  Do not take baths, swim, or use a hot tub until your health care provider approves. Ask your health care provider if you may take showers. You may only be allowed to take sponge baths.  Have someone help you with your daily household tasks for the first few days.  Keep all follow-up visits as told by your health care provider. This is important. Contact a health care provider if:  You have redness, swelling, or pain around your incision.  Your incision feels warm to the touch.  You have pus or a bad smell coming from your incision.  The edges of your incision break open after the sutures have been removed.  Your pain does not improve after 2-3 days.  You have a rash.  You repeatedly become dizzy or light-headed.  Your pain medicine is not helping. Get help right away if you:  Have a fever.  Faint.  Have increasing   pain in your abdomen.  Have severe pain in one or both of your shoulders.  Have fluid or blood coming from your sutures or from your vagina.  Have shortness of breath or difficulty breathing.  Have chest pain or leg pain.  Have ongoing nausea, vomiting, or diarrhea. Summary  After the procedure, it is common to have mild discomfort or cramping in your abdomen.  Take over-the-counter and prescription medicines only as told by your health care provider.  Watch for symptoms that should  prompt you to call your health care provider.  Keep all follow-up visits as told by your health care provider. This is important. This information is not intended to replace advice given to you by your health care provider. Make sure you discuss any questions you have with your health care provider. Document Revised: 02/23/2019 Document Reviewed: 08/11/2018 Elsevier Patient Education  2020 Elsevier Inc.   Diagnostic Laparoscopy, Care After This sheet gives you information about how to care for yourself after your procedure. Your health care provider may also give you more specific instructions. If you have problems or questions, contact your health care provider. What can I expect after the procedure? After the procedure, it is common to have:  Mild discomfort in the abdomen.  Sore throat. Women who have laparoscopy with pelvic examination may have mild cramping and fluid coming from the vagina for a few days after the procedure. Follow these instructions at home: Medicines  Take over-the-counter and prescription medicines only as told by your health care provider.  If you were prescribed an antibiotic medicine, take it as told by your health care provider. Do not stop taking the antibiotic even if you start to feel better. Driving  Do not drive for 24 hours if you were given a medicine to help you relax (sedative) during your procedure.  Do not drive or use heavy machinery while taking prescription pain medicine. Bathing  Do not take baths, swim, or use a hot tub until your health care provider approves. You may take showers. Incision care   Follow instructions from your health care provider about how to take care of your incisions. Make sure you: ? Wash your hands with soap and water before you change your bandage (dressing). If soap and water are not available, use hand sanitizer. ? Change your dressing as told by your health care provider. ? Leave stitches (sutures), skin  glue, or adhesive strips in place. These skin closures may need to stay in place for 2 weeks or longer. If adhesive strip edges start to loosen and curl up, you may trim the loose edges. Do not remove adhesive strips completely unless your health care provider tells you to do that.  Check your incision areas every day for signs of infection. Check for: ? Redness, swelling, or pain. ? Fluid or blood. ? Warmth. ? Pus or a bad smell. Activity  Return to your normal activities as told by your health care provider. Ask your health care provider what activities are safe for you.  Do not lift anything that is heavier than 10 lb (4.5 kg), or the limit that you are told, until your health care provider says that it is safe. General instructions  To prevent or treat constipation while you are taking prescription pain medicine, your health care provider may recommend that you: ? Drink enough fluid to keep your urine pale yellow. ? Take over-the-counter or prescription medicines. ? Eat foods that are high in fiber, such as fresh  fruits and vegetables, whole grains, and beans. ? Limit foods that are high in fat and processed sugars, such as fried and sweet foods.  Do not use any products that contain nicotine or tobacco, such as cigarettes and e-cigarettes. If you need help quitting, ask your health care provider.  Keep all follow-up visits as told by your health care provider. This is important. Contact a health care provider if:  You develop shoulder pain.  You feel lightheaded or faint.  You are unable to pass gas or have a bowel movement.  You feel nauseous or you vomit.  You develop a rash.  You have redness, swelling, or pain around any incision.  You have fluid or blood coming from any incision.  Any incision feels warm to the touch.  You have pus or a bad smell coming from any incision.  You have a fever or chills. Get help right away if:  You have severe pain.  You have  vomiting that does not go away.  You have heavy bleeding from the vagina.  Any incision opens.  You have trouble breathing.  You have chest pain. Summary  After the procedure, it is common to have mild discomfort in the abdomen and a sore throat.  Check your incision areas every day for signs of infection.  Return to your normal activities as told by your health care provider. Ask your health care provider what activities are safe for you. This information is not intended to replace advice given to you by your health care provider. Make sure you discuss any questions you have with your health care provider. Document Revised: 08/29/2017 Document Reviewed: 03/12/2017 Elsevier Patient Education  2020 Elsevier Inc.   AMBULATORY SURGERY  DISCHARGE INSTRUCTIONS   1) The drugs that you were given will stay in your system until tomorrow so for the next 24 hours you should not:  A) Drive an automobile B) Make any legal decisions C) Drink any alcoholic beverage   2) You may resume regular meals tomorrow.  Today it is better to start with liquids and gradually work up to solid foods.  You may eat anything you prefer, but it is better to start with liquids, then soup and crackers, and gradually work up to solid foods.   3) Please notify your doctor immediately if you have any unusual bleeding, trouble breathing, redness and pain at the surgery site, drainage, fever, or pain not relieved by medication.  4) Your post-operative visit with Dr Tiburcio Pea is on 09/13/2020 at 4:10pm.       5) Additional Instructions:

## 2020-08-29 NOTE — Anesthesia Procedure Notes (Signed)
Procedure Name: Intubation Date/Time: 08/29/2020 12:55 PM Performed by: Karoline Caldwell, CRNA Pre-anesthesia Checklist: Patient identified, Patient being monitored, Timeout performed, Emergency Drugs available and Suction available Patient Re-evaluated:Patient Re-evaluated prior to induction Oxygen Delivery Method: Circle system utilized Preoxygenation: Pre-oxygenation with 100% oxygen Induction Type: IV induction Ventilation: Mask ventilation without difficulty Laryngoscope Size: 3 and McGraph Grade View: Grade I Tube type: Oral Tube size: 6.5 mm Number of attempts: 1 Airway Equipment and Method: Stylet Placement Confirmation: ETT inserted through vocal cords under direct vision,  positive ETCO2 and breath sounds checked- equal and bilateral Secured at: 21 cm Tube secured with: Tape Dental Injury: Teeth and Oropharynx as per pre-operative assessment

## 2020-08-29 NOTE — Progress Notes (Signed)
KUB reviewed IUD present on films Likely retained intrauterine Unable to palpate during case, see note. No sign of perforation, unlikely intra-abdominal Will plan future office hysteroscopy and removal  Annamarie Major, MD, [facility], [facility] 08/29/2020  3:12 PM

## 2020-08-29 NOTE — Interval H&P Note (Signed)
History and Physical Interval Note:  08/29/2020 11:25 AM  Kathleen Joseph  has presented today for surgery, with the diagnosis of Sterilization Z30.02.  The various methods of treatment have been discussed with the patient and family. After consideration of risks, benefits and other options for treatment, the patient has consented to  Procedure(s): LAPAROSCOPIC TUBAL LIGATION (Bilateral) INTRAUTERINE DEVICE (IUD) REMOVAL (N/A) as a surgical intervention.  The patient's history has been reviewed, patient examined, no change in status, stable for surgery.  I have reviewed the patient's chart and labs.  Questions were answered to the patient's satisfaction.     Letitia Libra

## 2020-08-29 NOTE — Anesthesia Preprocedure Evaluation (Signed)
Anesthesia Evaluation  Patient identified by MRN, date of birth, ID band Patient awake    Reviewed: Allergy & Precautions, NPO status , Patient's Chart, lab work & pertinent test results  History of Anesthesia Complications Negative for: history of anesthetic complications  Airway Mallampati: II       Dental   Pulmonary neg sleep apnea, neg COPD, Not current smoker,           Cardiovascular hypertension, Pt. on medications (-) Past MI and (-) CHF (-) dysrhythmias (-) Valvular Problems/Murmurs     Neuro/Psych neg Seizures    GI/Hepatic Neg liver ROS, GERD  ,  Endo/Other  neg diabetes  Renal/GU negative Renal ROS     Musculoskeletal   Abdominal   Peds  Hematology   Anesthesia Other Findings   Reproductive/Obstetrics                             Anesthesia Physical Anesthesia Plan  ASA: II  Anesthesia Plan: General   Post-op Pain Management:    Induction: Intravenous  PONV Risk Score and Plan: 3 and Ondansetron and Dexamethasone  Airway Management Planned: Oral ETT  Additional Equipment:   Intra-op Plan:   Post-operative Plan:   Informed Consent: I have reviewed the patients History and Physical, chart, labs and discussed the procedure including the risks, benefits and alternatives for the proposed anesthesia with the patient or authorized representative who has indicated his/her understanding and acceptance.       Plan Discussed with:   Anesthesia Plan Comments:         Anesthesia Quick Evaluation

## 2020-08-29 NOTE — Transfer of Care (Signed)
Immediate Anesthesia Transfer of Care Note  Patient: Kathleen Joseph  Procedure(s) Performed: LAPAROSCOPIC TUBAL LIGATION (Bilateral )  Patient Location: PACU  Anesthesia Type:General  Level of Consciousness: awake, alert  and oriented  Airway & Oxygen Therapy: Patient Spontanous Breathing and Patient connected to face mask oxygen  Post-op Assessment: Report given to RN and Post -op Vital signs reviewed and stable  Post vital signs: Reviewed and stable  Last Vitals:  Vitals Value Taken Time  BP 143/88 08/29/20 1415  Temp 37.5 C 08/29/20 1415  Pulse 104 08/29/20 1420  Resp 12 08/29/20 1420  SpO2 100 % 08/29/20 1420  Vitals shown include unvalidated device data.  Last Pain:  Vitals:   08/29/20 1110  PainSc: 4          Complications: No complications documented.

## 2020-08-29 NOTE — Progress Notes (Signed)
POC pregnancy/HCG test negative on 08/29/2020 at 1120. Glucometer/ add/enter results down at this time.

## 2020-08-29 NOTE — Op Note (Signed)
  Operative Note   08/29/2020  PRE-OP DIAGNOSIS: Desire for permanent sterilization  POST-OP DIAGNOSIS: same   PROCEDURE: Procedure(s): LAPAROSCOPIC BILATERAL PARTIAL SALPINGECTOMY  SURGEON: Annamarie Major, MD, FACOG  ANESTHESIA: Choice   ESTIMATED BLOOD LOSS: 25 mL  COMPLICATIONS: None  DISPOSITION: PACU - hemodynamically stable.  CONDITION: stable  FINDINGS: Laparoscopic survey of the abdomen revealed a grossly normal uterus, tubes, ovaries, liver edge, gallbladder edge and appendix, Some filmy or omentum-based intra-abdominal adhesions were noted to the anterior abdominal wall.  Small simple right ovarian cyst noted.  No id strings seen or palpated, no IUS seen or palpated in uterus or in intra-abdominal cavity.  PROCEDURE IN DETAIL: The patient was taken to the OR where anesthesia was administed. The patient was positioned in dorsal lithotomy in the Melrose stirrups. The patient was then examined under anesthesia with the above noted findings. The patient was prepped and draped in the normal sterile fashion and bladder was drained using a red rubber cathater. Speculum exam normal, and a sponge stick was placed for manipulation purposes.  No IUD strings seen or felt w a uterine packing forceps, even to a dilation of the cervix to 20 Pratt dilator.  Attention was turned to the patient's abdomen where a 5 mm skin incision was made in the umbilical fold, after injection of local anesthesia. The Veress step needle was carefully introduced into the peritoneal cavity with placement confirmed using the hanging drop technique.  Pneumoperitoneum was obtained. The 5 mm port was then placed under direct visualization with the operative laparoscope  The above noted findings.  Trendelenburg.  A 5 mm trocar was then placed in the right lower quadrant under direct visualization with the laparoscope.  Right and left fallopian tubes are identified and followed out to their fimbria.  Each tube is excised  utilizing the Harmonic scapel to include the fibria.  No injuries or bleeding was noted.  No IUD is seen in abdominal cavity nor perforation signs of uterus.  All instruments and ports were then removed from the abdomen after gas was expelled and patient was leveled.   The skin was closed with skin adhesive. The patient tolerated the procedure well. All counts were correct x 2. The patient was transferred to the recovery room awake, alert and breathing independently.  Plan KUB to assess for IUD.  Likely expulsion prior to surgery.  Annamarie Major, MD, [facility], [facility] 08/29/2020  2:08 PM

## 2020-08-29 NOTE — Anesthesia Postprocedure Evaluation (Signed)
Anesthesia Post Note  Patient: Investment banker, corporate  Procedure(s) Performed: LAPAROSCOPIC TUBAL LIGATION (Bilateral )  Patient location during evaluation: PACU Anesthesia Type: General Level of consciousness: awake and awake and alert Pain management: pain level controlled Vital Signs Assessment: post-procedure vital signs reviewed and stable Respiratory status: spontaneous breathing Cardiovascular status: blood pressure returned to baseline and stable Postop Assessment: no apparent nausea or vomiting Anesthetic complications: no   No complications documented.   Last Vitals:  Vitals:   08/29/20 1415 08/29/20 1431  BP: (!) 143/88 123/78  Pulse: (!) 105 96  Resp: 18 12  Temp: 37.5 C   SpO2: 99% 100%    Last Pain:  Vitals:   08/29/20 1110  PainSc: 4                  Emilio Math

## 2020-08-31 LAB — SURGICAL PATHOLOGY

## 2020-09-13 ENCOUNTER — Ambulatory Visit (INDEPENDENT_AMBULATORY_CARE_PROVIDER_SITE_OTHER): Payer: Medicaid Other | Admitting: Obstetrics & Gynecology

## 2020-09-13 ENCOUNTER — Other Ambulatory Visit: Payer: Self-pay

## 2020-09-13 ENCOUNTER — Encounter: Payer: Self-pay | Admitting: Obstetrics & Gynecology

## 2020-09-13 VITALS — BP 120/80 | Ht 64.0 in | Wt 148.0 lb

## 2020-09-13 DIAGNOSIS — T8339XA Other mechanical complication of intrauterine contraceptive device, initial encounter: Secondary | ICD-10-CM

## 2020-09-13 DIAGNOSIS — Z9851 Tubal ligation status: Secondary | ICD-10-CM

## 2020-09-13 NOTE — Patient Instructions (Signed)
Hysteroscopy, Care After This sheet gives you information about how to care for yourself after your procedure. Your health care provider may also give you more specific instructions. If you have problems or questions, contact your health care provider. What can I expect after the procedure? After the procedure, it is common to have:  Cramping.  Bleeding. This can vary from light spotting to menstrual-like bleeding. Follow these instructions at home: Activity  Rest for 1-2 days after the procedure.  Do not douche, use tampons, or have sex for 2 weeks after the procedure, or until your health care provider approves.  Do not drive for 24 hours after the procedure, or for as long as told by your health care provider.  Do not drive, use heavy machinery, or drink alcohol while taking prescription pain medicines. Medicines   Take over-the-counter and prescription medicines only as told by your health care provider.  Do not take aspirin during recovery. It can increase the risk of bleeding. General instructions  Do not take baths, swim, or use a hot tub until your health care provider approves. Take showers instead of baths for 2 weeks, or for as long as told by your health care provider.  To prevent or treat constipation while you are taking prescription pain medicine, your health care provider may recommend that you: ? Drink enough fluid to keep your urine clear or pale yellow. ? Take over-the-counter or prescription medicines. ? Eat foods that are high in fiber, such as fresh fruits and vegetables, whole grains, and beans. ? Limit foods that are high in fat and processed sugars, such as fried and sweet foods.  Keep all follow-up visits as told by your health care provider. This is important. Contact a health care provider if:  You feel dizzy or lightheaded.  You feel nauseous.  You have abnormal vaginal discharge.  You have a rash.  You have pain that does not get better with  medicine.  You have chills. Get help right away if:  You have bleeding that is heavier than a normal menstrual period.  You have a fever.  You have pain or cramps that get worse.  You develop new abdominal pain.  You faint.  You have pain in your shoulders.  You have shortness of breath. Summary  After the procedure, you may have cramping and some vaginal bleeding.  Do not douche, use tampons, or have sex for 2 weeks after the procedure, or until your health care provider approves.  Do not take baths, swim, or use a hot tub until your health care provider approves. Take showers instead of baths for 2 weeks, or for as long as told by your health care provider.  Report any unusual symptoms to your health care provider.  Keep all follow-up visits as told by your health care provider. This is important. This information is not intended to replace advice given to you by your health care provider. Make sure you discuss any questions you have with your health care provider. Document Revised: 08/29/2017 Document Reviewed: 10/15/2016 Elsevier Patient Education  2020 Elsevier Inc.  

## 2020-09-13 NOTE — Progress Notes (Signed)
  Postoperative Follow-up Patient presents post op from laparoscopy and BTL for requested sterilization, 2 weeks ago.  Subjective: Patient reports marked improvement in her preop symptoms. Eating a regular diet without difficulty. The patient is not having any pain.  Activity: normal activities of daily living. Patient reports additional symptom's since surgery of None.  Objective: BP 120/80   Ht 5\' 4"  (1.626 m)   Wt 148 lb (67.1 kg)   LMP  (LMP Unknown) Comment: IUD, neg preg test today.  BMI 25.40 kg/m  Physical Exam Constitutional:      General: She is not in acute distress.    Appearance: She is well-developed and well-nourished.  Cardiovascular:     Rate and Rhythm: Normal rate.  Pulmonary:     Effort: Pulmonary effort is normal.  Abdominal:     General: There is no distension.     Palpations: Abdomen is soft.     Tenderness: There is no abdominal tenderness.     Comments: Incision Healing Well   Musculoskeletal:        General: Normal range of motion.  Neurological:     Mental Status: She is alert and oriented to person, place, and time.     Cranial Nerves: No cranial nerve deficit.  Skin:    General: Skin is warm and dry.  Psychiatric:        Mood and Affect: Mood and affect normal.     Assessment: s/p :  laparoscopy and tubal ligation progressing well  Plan: Patient has done well after surgery with no apparent complications.  I have discussed the post-operative course to date, and the expected progress moving forward.  The patient understands what complications to be concerned about.  I will see the patient in routine follow up, or sooner if needed.    Activity plan: No restriction.  Pt has retained IUD in uterus, as seen by XRAY after last surgery To schedule office hysteroscopy and removal IUD Pros and cons, risks discussed Meds provided (Valium, Cytotec, Phenergan, Norco) w instructions for use Consent obtained today  09/13/2020,  4:41 PM

## 2020-09-14 ENCOUNTER — Telehealth: Payer: Self-pay | Admitting: Obstetrics & Gynecology

## 2020-09-14 NOTE — Telephone Encounter (Signed)
Called patient to schedule in office Hysteroscopy for IUD removal w [facility]  DOS 1/6 @ 10:30  I adv pt that she will need to have an x-ray done over at [facility] prior to having the procedure done. She can go in the [facility] entrance and adv them why she is there. The order for the x-ray is in the system.    She will try to go Monday, 12/20.

## 2020-09-14 NOTE — Telephone Encounter (Signed)
-----   Message from Nadara Mustard, MD sent at 09/13/2020  4:37 PM EST ----- Regarding: procedure [facility] procedure room In Office Hysteroscopy for IUD removal    For Retained IUD  Consents obtained today  PH

## 2020-09-15 NOTE — Telephone Encounter (Signed)
Noted. CMA aware. 

## 2020-10-05 ENCOUNTER — Ambulatory Visit: Payer: Medicaid Other | Admitting: Obstetrics & Gynecology

## 2020-10-26 ENCOUNTER — Telehealth: Payer: Self-pay

## 2020-10-26 NOTE — Telephone Encounter (Signed)
Pt left msg on triage saying she has rescheduled appt she had with [place] due to insurance. Has a request for a Rx. Called her back to get more info, no answer, LVMTRC.

## 2020-10-27 ENCOUNTER — Ambulatory Visit: Payer: Medicaid Other | Admitting: Obstetrics & Gynecology

## 2020-11-08 ENCOUNTER — Ambulatory Visit: Payer: Self-pay | Admitting: Obstetrics & Gynecology

## 2021-10-08 ENCOUNTER — Ambulatory Visit
Admission: RE | Admit: 2021-10-08 | Discharge: 2021-10-08 | Disposition: A | Discharge status: Home / Self Care | Payer: Medicaid Other | Source: Ambulatory Visit | Attending: Physician Assistant | Admitting: Physician Assistant

## 2021-10-08 ENCOUNTER — Ambulatory Visit
Admission: RE | Admit: 2021-10-08 | Discharge: 2021-10-08 | Disposition: A | Discharge status: Home / Self Care | Payer: Medicaid Other | Attending: Physician Assistant | Admitting: Physician Assistant

## 2021-10-08 ENCOUNTER — Other Ambulatory Visit: Payer: Self-pay | Admitting: Physician Assistant

## 2021-10-08 DIAGNOSIS — M545 Low back pain, unspecified: Secondary | ICD-10-CM

## 2021-10-08 DIAGNOSIS — M542 Cervicalgia: Secondary | ICD-10-CM | POA: Insufficient documentation

## 2021-10-08 DIAGNOSIS — M546 Pain in thoracic spine: Secondary | ICD-10-CM

## 2021-11-19 ENCOUNTER — Other Ambulatory Visit: Payer: Self-pay | Admitting: Specialist

## 2021-11-19 ENCOUNTER — Other Ambulatory Visit (HOSPITAL_COMMUNITY): Payer: Self-pay | Admitting: Specialist

## 2021-11-19 DIAGNOSIS — G935 Compression of brain: Secondary | ICD-10-CM

## 2021-11-19 DIAGNOSIS — M419 Scoliosis, unspecified: Secondary | ICD-10-CM

## 2021-11-19 DIAGNOSIS — Q057 Lumbar spina bifida without hydrocephalus: Secondary | ICD-10-CM

## 2021-11-29 ENCOUNTER — Ambulatory Visit
Admission: RE | Admit: 2021-11-29 | Discharge: 2021-11-29 | Disposition: A | Discharge status: Home / Self Care | Payer: Medicaid Other | Source: Ambulatory Visit | Attending: Specialist | Admitting: Specialist

## 2021-11-29 DIAGNOSIS — M419 Scoliosis, unspecified: Secondary | ICD-10-CM

## 2021-11-29 DIAGNOSIS — G935 Compression of brain: Secondary | ICD-10-CM | POA: Insufficient documentation

## 2021-11-29 DIAGNOSIS — Q057 Lumbar spina bifida without hydrocephalus: Secondary | ICD-10-CM | POA: Insufficient documentation

## 2022-03-13 ENCOUNTER — Other Ambulatory Visit: Payer: Self-pay | Admitting: Family Medicine

## 2022-03-13 DIAGNOSIS — Z975 Presence of (intrauterine) contraceptive device: Secondary | ICD-10-CM

## 2022-03-20 ENCOUNTER — Ambulatory Visit
Admission: RE | Admit: 2022-03-20 | Discharge: 2022-03-20 | Disposition: A | Discharge status: Home / Self Care | Payer: Medicaid Other | Source: Ambulatory Visit | Attending: Family Medicine | Admitting: Family Medicine

## 2022-03-20 DIAGNOSIS — Z975 Presence of (intrauterine) contraceptive device: Secondary | ICD-10-CM | POA: Diagnosis not present

## 2022-07-28 ENCOUNTER — Other Ambulatory Visit: Payer: Self-pay

## 2022-07-28 ENCOUNTER — Emergency Department: Payer: Medicaid Other

## 2022-07-28 ENCOUNTER — Emergency Department
Admission: EM | Admit: 2022-07-28 | Discharge: 2022-07-28 | Disposition: A | Discharge status: Home / Self Care | Payer: Medicaid Other | Attending: Emergency Medicine | Admitting: Emergency Medicine

## 2022-07-28 ENCOUNTER — Encounter: Payer: Self-pay | Admitting: Intensive Care

## 2022-07-28 DIAGNOSIS — S299XXA Unspecified injury of thorax, initial encounter: Secondary | ICD-10-CM | POA: Diagnosis present

## 2022-07-28 DIAGNOSIS — S22088A Other fracture of T11-T12 vertebra, initial encounter for closed fracture: Secondary | ICD-10-CM | POA: Insufficient documentation

## 2022-07-28 DIAGNOSIS — S0083XA Contusion of other part of head, initial encounter: Secondary | ICD-10-CM | POA: Diagnosis not present

## 2022-07-28 DIAGNOSIS — S060X0A Concussion without loss of consciousness, initial encounter: Secondary | ICD-10-CM | POA: Diagnosis not present

## 2022-07-28 DIAGNOSIS — S22080A Wedge compression fracture of T11-T12 vertebra, initial encounter for closed fracture: Secondary | ICD-10-CM

## 2022-07-28 DIAGNOSIS — I10 Essential (primary) hypertension: Secondary | ICD-10-CM | POA: Diagnosis not present

## 2022-07-28 HISTORY — DX: Spina bifida, unspecified: Q05.9

## 2022-07-28 HISTORY — DX: Hereditary motor and sensory neuropathy: G60.0

## 2022-07-28 LAB — POC URINE PREG, ED: Preg Test, Ur: NEGATIVE

## 2022-07-28 MED ORDER — MELOXICAM 15 MG PO TABS: 15.0000 mg | ORAL_TABLET | Freq: Every day | ORAL | 2 refills | Status: DC

## 2022-07-28 MED ORDER — OXYCODONE-ACETAMINOPHEN 5-325 MG PO TABS: 1.0000 | ORAL_TABLET | ORAL | 0 refills | Status: DC | PRN

## 2022-07-28 MED ORDER — OXYCODONE-ACETAMINOPHEN 5-325 MG PO TABS
1.0000 | ORAL_TABLET | Freq: Once | ORAL | Status: AC
  Administered 2022-07-28: 1 via ORAL
  Filled 2022-07-28: qty 1

## 2022-07-28 NOTE — ED Notes (Signed)
TSLO brace ordered, and will be brought to the ER

## 2022-07-28 NOTE — Progress Notes (Signed)
Orthopedic Tech Progress Note Patient Details:  Kameka Whan 09-Apr-1986 [id number]   Order for a TLSO called into [facility] @ 1523.   Patient ID: Ellanora Rayborn, female   DOB: May 02, 1986, 36 y.o.   MRN: [id number]  Carin Primrose 07/28/2022, 3:25 PM
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
## 2022-07-28 NOTE — Discharge Instructions (Signed)
Follow-up with Dr. Cari Caraway for the spinal compression fracture Return emergency department worsening
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
## 2022-08-08 ENCOUNTER — Telehealth: Payer: Self-pay

## 2022-08-08 NOTE — Telephone Encounter (Signed)
Patient declined appt because she has to see her attorney at that same time. She will call her PCP and check with them if they can call her in medication to get her through to her appt. She has been added to the cancellation list.

## 2022-08-08 NOTE — Telephone Encounter (Signed)
Dr Jeannie Fend said he will see her tomorrow at 65. If she can't come tomorrow, please add her to the cancellation list and she will probably have to ask her PCP to fill her meds until we see her b/c we can't give meds to someone we have never seen.
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
## 2022-08-12 ENCOUNTER — Telehealth: Payer: Self-pay

## 2022-08-12 DIAGNOSIS — S22080A Wedge compression fracture of T11-T12 vertebra, initial encounter for closed fracture: Secondary | ICD-10-CM

## 2022-08-12 NOTE — Telephone Encounter (Signed)
-----   Message from Cristin E Ray sent at 08/12/2022  3:32 PM EST ----- Regarding: Back Brace 10/29 in the hospital and given a back brace, was advised to continue wearing the back brace but says it is making her lean and causing pain towards her tail bone. Wants to know what else she could do or if she should continue to wear it? Also wanted to know about pain meds as she is still in a lot of pain. Is currently out of oxyCODONE-acetaminophen (PERCOCET) 5-325 MG tablet Said taking just one didn't even touch the pain.   Is still taking meloxicam (MOBIC) 15 MG tablet  Please contact at home number [phone] Cell phone pout of service at the moment. Call instead of mychart please.

## 2022-08-12 NOTE — Telephone Encounter (Signed)
It looks like Kathleen Joseph has a cancellation for tomorrow. Please offer that to her.   In regards to pain medications, we discussed on 11/9 that we legally cannot prescribe medication for a patient we have never seen. That is why Dr Myer Haff offered to add her to clinic last week, but she declined the appointment. If she cannot come in tomorrow, she will have to ask her PCP for medications until she is seen.  For the brace, we advise she wear it whenever she is up and out of bed. If she is coming in tomorrow, Kathleen Joseph can evaluate and advise at her appointment. If she isn't coming in tomorrow, she can contact [facility] to see if they have any suggestions or adjustments they can make.

## 2022-08-12 NOTE — Telephone Encounter (Addendum)
Kathleen Joseph would like updated thoracic xrays. Order placed. I notified the patient to go to [place] 45-60 mins prior for xrays.
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
## 2022-08-13 ENCOUNTER — Ambulatory Visit
Admission: RE | Admit: 2022-08-13 | Discharge: 2022-08-13 | Disposition: A | Discharge status: Home / Self Care | Payer: Medicaid Other | Attending: Orthopedic Surgery | Admitting: Orthopedic Surgery

## 2022-08-13 ENCOUNTER — Ambulatory Visit (INDEPENDENT_AMBULATORY_CARE_PROVIDER_SITE_OTHER): Payer: Medicaid Other | Admitting: Orthopedic Surgery

## 2022-08-13 ENCOUNTER — Encounter: Payer: Self-pay | Admitting: Orthopedic Surgery

## 2022-08-13 ENCOUNTER — Ambulatory Visit
Admission: RE | Admit: 2022-08-13 | Discharge: 2022-08-13 | Disposition: A | Discharge status: Home / Self Care | Payer: Medicaid Other | Source: Ambulatory Visit | Attending: Orthopedic Surgery | Admitting: Orthopedic Surgery

## 2022-08-13 VITALS — BP 116/70 | Ht 64.0 in | Wt 138.4 lb

## 2022-08-13 DIAGNOSIS — S22080A Wedge compression fracture of T11-T12 vertebra, initial encounter for closed fracture: Secondary | ICD-10-CM | POA: Diagnosis present

## 2022-08-13 DIAGNOSIS — G600 Hereditary motor and sensory neuropathy: Secondary | ICD-10-CM | POA: Diagnosis not present

## 2022-08-13 MED ORDER — OXYCODONE-ACETAMINOPHEN 5-325 MG PO TABS: 1.0000 | ORAL_TABLET | Freq: Four times a day (QID) | ORAL | 0 refills | Status: DC | PRN

## 2022-08-13 NOTE — Patient Instructions (Signed)
It was so nice to see you today, I am sorry that you are hurting so much.   You have a compression fracture at T12 and I think this is causing your back pain.   I want to get an MRI of your mid back to look into this further. They will call you to schedule once this is approved.   I sent a refill of oxycodone to your pharmacy. Use only as needed and be careful, this can make you sleepy and/or constipated. Take the least amount possible.   I recommend that you continue wearing the back brace.   No bending, twisting, or lifting.   I put in a referral for you to see neurology for Charcot Marie Tooth.   Will set up phone visit to discuss MRI results.   Please do not hesitate to call if you have any questions or concerns. You can also message me in MyChart.   If you have not heard back about any of the tests/procedures in the next week, please call the office so we can help you get these things scheduled.   Drake Leach PA-C [phone]

## 2022-09-03 ENCOUNTER — Ambulatory Visit: Payer: Medicaid Other | Admitting: Neurosurgery

## 2022-09-08 ENCOUNTER — Ambulatory Visit
Admission: RE | Admit: 2022-09-08 | Discharge: 2022-09-08 | Disposition: A | Discharge status: Home / Self Care | Payer: Medicaid Other | Source: Ambulatory Visit | Attending: Orthopedic Surgery | Admitting: Orthopedic Surgery

## 2022-09-08 DIAGNOSIS — G600 Hereditary motor and sensory neuropathy: Secondary | ICD-10-CM | POA: Insufficient documentation

## 2022-09-08 DIAGNOSIS — S22080A Wedge compression fracture of T11-T12 vertebra, initial encounter for closed fracture: Secondary | ICD-10-CM | POA: Diagnosis present

## 2022-09-09 ENCOUNTER — Telehealth: Payer: Self-pay

## 2022-09-09 NOTE — Telephone Encounter (Signed)
I don't have results for MRI yet.   Please set her up for phone visit with me to review MRI on Wednesday morning.

## 2022-09-09 NOTE — Telephone Encounter (Signed)
Tomorrow is fine if I have an opening.

## 2022-09-09 NOTE — Telephone Encounter (Signed)
LINICAL DATA:  Initial evaluation for compression fracture. History of recent fall 3 weeks ago.   EXAM: MRI THORACIC SPINE WITHOUT CONTRAST   TECHNIQUE: Multiplanar, multisequence MR imaging of the thoracic spine was performed. No intravenous contrast was administered.   COMPARISON:  Prior CT from 07/28/2022 as well as previous MRI from 11/29/2021.   FINDINGS: Alignment: Straightening of the normal midthoracic kyphosis. Underlying mild dextroscoliosis. No listhesis.   Vertebrae: Evolving subacute compression fracture involving the superior endplate of T11 is seen. Associated height loss measures up to 15% without bony retropulsion. This is benign/mechanical in appearance.   Otherwise, vertebral body height maintained with no other acute or chronic fracture. Underlying bone marrow signal intensity within normal limits. Involving the right transverse process of T7. N probable atypical hemangioma noted o worrisome osseous lesions. Minimal discogenic reactive endplate changes noted about the T6-7 interspace. No other abnormal marrow edema.   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   Paraspinal and other soft tissues: Unremarkable.   Disc levels:   Minimal degenerative spondylosis present at T6-7, T7-8, and T10-11. No focal disc herniation. No significant spinal stenosis. Foramina remain patent. No neural impingement.   IMPRESSION: 1. Evolving subacute compression fracture involving the superior endplate of T11 with up to 15% height loss without bony retropulsion. This is benign/mechanical in appearance. 2. 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 3. Minimal degenerative spondylosis at T6-7, T7-8, and T10-11 without significant stenosis or neural impingement.     Electronically Signed   By: Rise Mu M.D.   On: 09/08/2022 18:18

## 2022-09-09 NOTE — Telephone Encounter (Signed)
-----   Message from Rockey Situ sent at 09/09/2022  8:42 AM EST ----- Regarding: MRI results Contact: [phone] Patient had her MRI yesterday. Do you need to see her or call her with the results?
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
## 2022-09-09 NOTE — Telephone Encounter (Signed)
She asked for tomorrow because she will be at work all day Wednesday.

## 2022-09-10 ENCOUNTER — Ambulatory Visit (INDEPENDENT_AMBULATORY_CARE_PROVIDER_SITE_OTHER): Payer: Medicaid Other | Admitting: Orthopedic Surgery

## 2022-09-10 ENCOUNTER — Encounter: Payer: Self-pay | Admitting: Orthopedic Surgery

## 2022-09-10 ENCOUNTER — Telehealth: Payer: Self-pay | Admitting: Orthopedic Surgery

## 2022-09-10 DIAGNOSIS — G600 Hereditary motor and sensory neuropathy: Secondary | ICD-10-CM | POA: Diagnosis not present

## 2022-09-10 DIAGNOSIS — S22080G Wedge compression fracture of T11-T12 vertebra, subsequent encounter for fracture with delayed healing: Secondary | ICD-10-CM | POA: Diagnosis not present

## 2022-09-10 MED ORDER — OXYCODONE-ACETAMINOPHEN 5-325 MG PO TABS: 1.0000 | ORAL_TABLET | Freq: Three times a day (TID) | ORAL | 0 refills | Status: DC | PRN

## 2022-09-10 NOTE — Telephone Encounter (Signed)
-----   Message from Rockey Situ sent at 09/10/2022 10:47 AM EST ----- Regarding: pain medtication Contact: [phone] You spoke to her this morning. Is there anything that you can prescribe for pain. She has a burning sensation in her back between mid back and lower back. [facility] [address] [employer]
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
## 2022-09-10 NOTE — Telephone Encounter (Signed)
Patient notified of medication refill. 

## 2022-09-11 ENCOUNTER — Other Ambulatory Visit: Payer: Self-pay | Admitting: Orthopedic Surgery

## 2022-09-11 DIAGNOSIS — G600 Hereditary motor and sensory neuropathy: Secondary | ICD-10-CM

## 2022-09-11 DIAGNOSIS — S22080G Wedge compression fracture of T11-T12 vertebra, subsequent encounter for fracture with delayed healing: Secondary | ICD-10-CM

## 2022-09-17 ENCOUNTER — Ambulatory Visit
Admission: RE | Admit: 2022-09-17 | Discharge: 2022-09-17 | Disposition: A | Discharge status: Home / Self Care | Payer: Medicaid Other | Source: Ambulatory Visit | Attending: Orthopedic Surgery | Admitting: Orthopedic Surgery

## 2022-09-17 DIAGNOSIS — S22080G Wedge compression fracture of T11-T12 vertebra, subsequent encounter for fracture with delayed healing: Secondary | ICD-10-CM

## 2022-09-17 DIAGNOSIS — G600 Hereditary motor and sensory neuropathy: Secondary | ICD-10-CM

## 2022-09-17 HISTORY — PX: IR RADIOLOGIST EVAL & MGMT: IMG5224

## 2022-09-17 NOTE — H&P (Signed)
Interventional Radiology - Clinic Visit, Initial H&P    Referring Provider: Drake Leach, PA-C  Reason for Visit: T11 compression fracture with delayed healing     History of Present Illness  Kathleen Joseph is a 36 y.o. 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  Patient presents today for persistent back pain after a fall leading to fracture of the T11 vertebral body.  Patient reports multiple falls over the last several months, estimating she likely fractured her back within the last 2-4 months.  MRI of the thoracic spine on September 08, 2022 demonstrated persistent marrow edema in the T11 vertebral body, compatible with a subacute fracture with approximately 15% height loss.  She is currently being followed in a neurosurgery clinic, and has been using a TLSO brace for over 1 month.  She reports no significant improvement with her back brace.  She rates her pain at baseline 10/10, with mild improvement to 7/10 with prescription pain medication that she takes every 4-6 hours (oxycodone/Tylenol 5-325 mg).    She reports significant disability on the [employer] disability questionnaire with 14/24 positive.  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    Additionally, her past medical history is significant for Charcot Marie Tooth.  This has resulted in a baseline low tolerance for prolonged standing or walking due to underlying neuromuscular disorder, which has only been exacerbated by her recent thoracic vertebral body fracture.    Additional Past Medical History Past Medical History:  Diagnosis Date   CMTD (Charcot-Marie-Tooth disease)    GERD (gastroesophageal reflux disease)    Headache    Hypertension    Neuromuscular disorder (HCC)    Charcot-Marie-Tooth  Disease   Spina bifida (HCC)      Surgical History  Past Surgical History:  Procedure Laterality Date   CESAREAN SECTION     LAPAROSCOPIC TUBAL LIGATION Bilateral 08/29/2020   Procedure: LAPAROSCOPIC TUBAL LIGATION;  Surgeon: Nadara Mustard, MD;  Location: [facility] ORS;  Service: Gynecology;  Laterality: Bilateral;     Medications  I have reviewed the current medication list. Refer to chart for details. Current Outpatient Medications  Medication Instructions   hydrochlorothiazide (MICROZIDE) 12.5 mg, Oral, Daily   levETIRAcetam (KEPPRA) 250 mg, 2 times daily   meloxicam (MOBIC) 15 mg, Oral, Daily   Multiple Vitamin (MULTIVITAMIN WITH MINERALS) TABS tablet 1 tablet, Oral, Daily   oxyCODONE-acetaminophen (PERCOCET) 5-325 MG tablet 1 tablet, Oral, Every 8 hours PRN      Allergies No Known Allergies Does patient have contrast allergy: No     Physical Exam Current Vitals Temp: 98.5 F (36.9 C) (Temp Source: Oral)  Pulse Rate: 60  Resp: 14  BP: (!) 131/90  SpO2: 99 %        There is no height or weight on file to calculate BMI.  General: Alert and answers questions appropriately.  HEENT: Normocephalic, atraumatic.  Cardiac: Regular rate and rhythm.  Pulmonary: Normal work of breathing. On room air. Back: TTP over lower mid back.    Pertinent Lab Results    Latest Ref Rng & Units 08/28/2020   11:17 AM 10/28/2013   12:25 PM  CBC  WBC 4.0 - 10.5 K/uL 6.9  6.6   Hemoglobin 12.0 - 15.0 g/dL 38.1  82.9   Hematocrit 36.0 -  46.0 % 41.1  40.2   Platelets 150 - 400 K/uL 185  154       Latest Ref Rng & Units 08/28/2020   11:17 AM 10/15/2019    6:32 PM  CMP  Glucose 70 - 99 mg/dL 440    BUN 6 - 20 mg/dL 11    Creatinine 1.02 - 1.00 mg/dL 7.25  3.66   Sodium 440 - 145 mmol/L 136    Potassium 3.5 - 5.1 mmol/L 3.2    Chloride 98 - 111 mmol/L 100    CO2 22 - 32 mmol/L 26    Calcium 8.9 - 10.3 mg/dL 9.5        Relevant and/or Recent Imaging: MRI T spine Sep 08 2022   IMPRESSION: 1. Evolving subacute compression fracture involving the superior endplate of T11 with up to 15% height loss without bony retropulsion. This is benign/mechanical in appearance. 2. 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 3. Minimal degenerative spondylosis at T6-7, T7-8, and T10-11 without significant stenosis or neural impingement. Electronically Signed By: Rise Mu M.D. On: 09/08/2022 18:18     Assessment & Plan:   Patient has suffered subacute  traumatic fracture of the T11 vertebra. She has failed conservative management including narcotic pain medication and TLSO bracing under neurosurgical follow up. She has significant disability resulting in her inability to work and provide for herself. Her underlying neuromuscular disorder (Charcot Hilda Lias Tooth) has also resulted in worsened disability with her superimposed T11 fracture with delayed healing.   History and exam have demonstrated the following:  Acute/Subacute fracture by imaging dated 09/08/2022, Pain on exam concordant with level of fracture, Failure of conservative therapy and pain refractory to narcotic pain mediation, and Significant disability on the [employer] Disability Questionnaire with 14/24 positive symptoms, reflecting significant impact/impairment of (ADLs)   ICD-10-CM Codes that Support Medical Necessity (WelshBlog.at.[email or web address]?[email or web address])  S22.080A    Wedge compression fracture of T11-T12 vertebra, initial encounter for closed fracture    Plan:  T11 vertebral body augmentation with balloon kyphoplasty  Post-procedure disposition: outpatient DRI-A  Medication holds: none   The patient has suffered a fracture of the T11 vertebral body. It is recommended that patients aged 70 years or older be evaluated for possible testing or treatment of osteoporosis. A copy of this consult report is sent  to the patient's referring physician.  Advanced Care Plan: The patient did not want to provide an Advanced Care Plan at the time of this visit     Total time spent on today's visit was over 60 Minutes, including both face-to-face time and non face-to-face time, personally spent on review of chart (including labs and relevant imaging), discussing further workup and treatment options, referral to specialist if needed, reviewing outside records if pertinent, answering patient questions, and coordinating care regarding T11 fracture as well as management strategy.      Olive Bass, MD  Vascular and Interventional Radiology 09/17/2022 12:09 PM

## 2022-10-03 ENCOUNTER — Other Ambulatory Visit: Payer: Medicaid Other

## 2023-02-19 ENCOUNTER — Encounter: Payer: Self-pay | Admitting: Physician Assistant

## 2023-02-19 ENCOUNTER — Ambulatory Visit (INDEPENDENT_AMBULATORY_CARE_PROVIDER_SITE_OTHER): Payer: Medicaid Other | Admitting: Physician Assistant

## 2023-02-19 VITALS — BP 147/90 | HR 66 | Temp 98.4°F | Ht 64.0 in | Wt 133.4 lb

## 2023-02-19 DIAGNOSIS — K219 Gastro-esophageal reflux disease without esophagitis: Secondary | ICD-10-CM

## 2023-02-19 DIAGNOSIS — R1013 Epigastric pain: Secondary | ICD-10-CM

## 2023-02-19 DIAGNOSIS — R6881 Early satiety: Secondary | ICD-10-CM | POA: Diagnosis not present

## 2023-02-19 MED ORDER — OMEPRAZOLE 40 MG PO CPDR: 40.0000 mg | DELAYED_RELEASE_CAPSULE | Freq: Every day | ORAL | 3 refills | Status: DC

## 2023-02-19 NOTE — Patient Instructions (Addendum)
Upper Gi series test scheduled @ [facility] @ 02/27/23 @ 10:45. Nothing to eat /drink after midnight.

## 2023-02-19 NOTE — Progress Notes (Signed)
Celso Amy, PA-C [address]  [address]  [place], [place] [address]  Main: [phone]  Fax: [phone]   Gastroenterology Consultation  Referring Provider:     Lorn Junes, FNP Primary Care Physician:  Lorn Junes, FNP Primary Gastroenterologist:  Celso Amy, PA-C / Dr. Wyline Mood  Reason for Consultation:     Early satiety        HPI:   Kathleen Joseph is a 37 y.o. y/o female referred for consultation & management  by Lorn Junes, FNP.    Here to evaluate early satiety.  No previous GI evaluation.  Patient states she has had epigastric discomfort and early satiety for greater than 1 year.  Has history of acid reflux and heartburn for several years.  She is not currently taking any treatment for acid reflux.  Occasionally takes OTC Tums which helps.  Has not tried H2 RB or PPI.  She denies dysphagia, unintentional weight loss, nausea, vomiting, diarrhea, or constipation.  Has history of Charcot-Marie-Tooth neuromuscular disorder and spina bifida.  Had T11 compression fracture 06/2022.  Has back pain and chronic weakness in her arms and legs.  Past Medical History:  Diagnosis Date   CMTD (Charcot-Marie-Tooth disease)    GERD (gastroesophageal reflux disease)    Headache    Hypertension    Neuromuscular disorder (HCC)    Charcot-Marie-Tooth Disease   Spina bifida (HCC)     Past Surgical History:  Procedure Laterality Date   CESAREAN SECTION     IR RADIOLOGIST EVAL & MGMT  09/17/2022   LAPAROSCOPIC TUBAL LIGATION Bilateral 08/29/2020   Procedure: LAPAROSCOPIC TUBAL LIGATION;  Surgeon: Nadara Mustard, MD;  Location: [facility] ORS;  Service: Gynecology;  Laterality: Bilateral;    Prior to Admission medications   Medication Sig Start Date End Date Taking? Authorizing Provider  hydrochlorothiazide (MICROZIDE) 12.5 MG capsule Take 12.5 mg by mouth daily. 06/09/20   [provider]  levETIRAcetam (KEPPRA) 250 MG tablet Take 250 mg by mouth 2  (two) times daily.  Patient not taking: Reported on 09/17/2022 02/10/20   [provider]  meloxicam (MOBIC) 15 MG tablet Take 1 tablet (15 mg total) by mouth daily. 07/28/22 07/28/23  Fisher, Roselyn Bering, PA-C  Multiple Vitamin (MULTIVITAMIN WITH MINERALS) TABS tablet Take 1 tablet by mouth daily.    [provider]  oxyCODONE-acetaminophen (PERCOCET) 5-325 MG tablet Take 1 tablet by mouth every 8 (eight) hours as needed for severe pain. 09/10/22 09/10/23  Drake Leach, PA-C    History reviewed. No pertinent family history.   Social History   Tobacco Use   Smoking status: Former    Types: Cigarettes   Smokeless tobacco: Never  Vaping Use   Vaping Use: Never used  Substance Use Topics   Alcohol use: Yes    Alcohol/week: 5.0 standard drinks of alcohol    Types: 5 Cans of beer per week   Drug use: Never    Allergies as of 02/19/2023   (No Known Allergies)    Review of Systems:    All systems reviewed and negative except where noted in HPI.   Physical Exam:  BP (!) 147/90   Pulse 66   Temp 98.4 F (36.9 C)   Ht 5\' 4"  (1.626 m)   Wt 133 lb 6.4 oz (60.5 kg)   BMI 22.90 kg/m  No LMP recorded. (Menstrual status: IUD). Psych:  Alert and cooperative. Normal mood and affect. General:   Alert,  Well-developed, well-nourished, pleasant  and cooperative in NAD Head:  Normocephalic and atraumatic. Eyes:  Sclera clear, no icterus.   Conjunctiva pink. Neck:  Supple; no masses or thyromegaly. Lungs:  Respirations even and unlabored.  Clear throughout to auscultation.   No wheezes, crackles, or rhonchi. No acute distress. Heart:  Regular rate and rhythm; no murmurs, clicks, rubs, or gallops. Abdomen:  Normal bowel sounds.  No bruits.  Soft, and non-distended without masses, hepatosplenomegaly or hernias noted.  Mild epigastric discomfort, otherwise No abdominal Tenderness.  No guarding or rebound tenderness.    Neurologic:  Alert and oriented x3;  grossly normal  neurologically. Psych:  Alert and cooperative. Normal mood and affect.  Imaging Studies: No results found.  Assessment and Plan:   [name] is a 37 y.o. y/o female has been referred for   Early Satiety Epigastric Pain (Discomfort) GERD  Plan: Schedule Upper GI Series.  Start Rx Omeprazole 40mg  1 tablet once daily.  F/U OV in 4 weeks.  If no better, then schedule EGD.  Recommend Lifestyle Modifications to prevent Acid Reflux.  Rec. Avoid coffee, sodas, peppermint, citrus fruits, and spicey foods.  Avoid eating 2-3 hours before bedtime.    Follow up in 4 weeks with TG.  Celso Amy, PA-C
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
## 2023-02-26 ENCOUNTER — Encounter: Payer: Self-pay | Admitting: Orthopedic Surgery

## 2023-02-26 ENCOUNTER — Ambulatory Visit (INDEPENDENT_AMBULATORY_CARE_PROVIDER_SITE_OTHER): Payer: Medicaid Other | Admitting: Orthopedic Surgery

## 2023-02-26 VITALS — BP 112/72 | Ht 64.0 in | Wt 136.0 lb

## 2023-02-26 DIAGNOSIS — M5416 Radiculopathy, lumbar region: Secondary | ICD-10-CM

## 2023-02-26 DIAGNOSIS — S22080D Wedge compression fracture of T11-T12 vertebra, subsequent encounter for fracture with routine healing: Secondary | ICD-10-CM

## 2023-02-26 DIAGNOSIS — G600 Hereditary motor and sensory neuropathy: Secondary | ICD-10-CM

## 2023-02-26 DIAGNOSIS — M21372 Foot drop, left foot: Secondary | ICD-10-CM

## 2023-02-26 DIAGNOSIS — M21371 Foot drop, right foot: Secondary | ICD-10-CM

## 2023-02-26 DIAGNOSIS — S22080S Wedge compression fracture of T11-T12 vertebra, sequela: Secondary | ICD-10-CM

## 2023-02-26 NOTE — Progress Notes (Signed)
I have faxed the AFO order to [facility] and scanned the order into her chart.

## 2023-02-26 NOTE — Patient Instructions (Signed)
It was so nice to see you today. Thank you so much for coming in.    I want to get an MRI of your mid and lower back to look into things further. We will get this approved through your insurance and Outpatient [facility] will call you to schedule the appointment.   I sent orders for AFO brace for both feet to [facility]. They should call you.   I want you to see pain management here in [facility]). They should call you to schedule an appointment or you can call them at [phone].   I want you to see neurology at the [facility] clinic for your CMT. They should call you to schedule an appointment or you can call them at [phone].   Once I have the MRI results back, we will call you to schedule a phone visit.   Please do not hesitate to call if you have any questions or concerns. You can also message me in MyChart.   Drake Leach PA-C [phone]

## 2023-02-27 ENCOUNTER — Ambulatory Visit: Payer: Medicaid Other

## 2023-03-07 ENCOUNTER — Ambulatory Visit
Admission: RE | Admit: 2023-03-07 | Discharge: 2023-03-07 | Disposition: A | Discharge status: Home / Self Care | Payer: Medicaid Other | Source: Ambulatory Visit | Attending: Physician Assistant | Admitting: Physician Assistant

## 2023-03-07 DIAGNOSIS — R6881 Early satiety: Secondary | ICD-10-CM | POA: Diagnosis not present

## 2023-03-07 DIAGNOSIS — R1013 Epigastric pain: Secondary | ICD-10-CM | POA: Diagnosis present

## 2023-03-07 NOTE — Progress Notes (Signed)
Notify patient upper GI series is normal.  Esophagus and stomach are normal.  Continue plan to treat acid reflux and keep follow-up OV as scheduled.

## 2023-03-10 ENCOUNTER — Telehealth: Payer: Self-pay

## 2023-03-10 NOTE — Telephone Encounter (Signed)
Left message for patient to return call to go over results.    Notify patient upper GI series is normal.  Esophagus and stomach are normal.  Continue plan to treat acid reflux and keep follow-up OV as scheduled.

## 2023-03-11 ENCOUNTER — Ambulatory Visit
Admission: RE | Admit: 2023-03-11 | Discharge: 2023-03-11 | Disposition: A | Discharge status: Home / Self Care | Payer: Medicaid Other | Source: Ambulatory Visit | Attending: Orthopedic Surgery | Admitting: Orthopedic Surgery

## 2023-03-11 DIAGNOSIS — G600 Hereditary motor and sensory neuropathy: Secondary | ICD-10-CM

## 2023-03-11 DIAGNOSIS — M5416 Radiculopathy, lumbar region: Secondary | ICD-10-CM | POA: Insufficient documentation

## 2023-03-11 DIAGNOSIS — S22080S Wedge compression fracture of T11-T12 vertebra, sequela: Secondary | ICD-10-CM

## 2023-03-11 NOTE — Telephone Encounter (Signed)
Patient called back and verbalized understanding of results  

## 2023-03-17 DIAGNOSIS — M2390 Unspecified internal derangement of unspecified knee: Secondary | ICD-10-CM | POA: Insufficient documentation

## 2023-03-19 ENCOUNTER — Encounter: Payer: Self-pay | Admitting: Physician Assistant

## 2023-03-19 ENCOUNTER — Other Ambulatory Visit: Payer: Self-pay

## 2023-03-19 ENCOUNTER — Ambulatory Visit (INDEPENDENT_AMBULATORY_CARE_PROVIDER_SITE_OTHER): Payer: Medicaid Other | Admitting: Physician Assistant

## 2023-03-19 VITALS — BP 119/79 | HR 74 | Temp 98.2°F | Ht 64.0 in | Wt 136.0 lb

## 2023-03-19 DIAGNOSIS — K219 Gastro-esophageal reflux disease without esophagitis: Secondary | ICD-10-CM | POA: Diagnosis not present

## 2023-03-19 DIAGNOSIS — R1013 Epigastric pain: Secondary | ICD-10-CM

## 2023-03-19 NOTE — Patient Instructions (Signed)
Decrease omeprazole to 20 mg once daily for the next week.  Then 20 mg every other day for 1 week.  Then discontinue. After you have been off of omeprazole for 2 weeks, then we will have you return to our office to do a breath test to check for H. pylori bacteria.

## 2023-03-19 NOTE — Progress Notes (Signed)
Celso Amy, PA-C [address]  [address]  [place], [place] [address]  Main: [phone]  Fax: [phone]   Primary Care Physician: Lorn Junes, FNP  Primary Gastroenterologist:  Celso Amy, PA-C / Dr. Lannette Donath   CC: F/U early satiety, epigastric pain, and GERD  HPI: Kathleen Joseph is a 37 y.o. female returns for 1 month follow-up of early satiety, epigastric pain, and GERD.  Was started on omeprazole 40 Mg 1 tablet once daily 1 month ago with benefit.  She is feeling a lot better.Marland Kitchen  Upper GI series 03/07/2023 was normal.  Appetite has improved.  She is able to eat more.  Still gets full easily.  Still has some epigastric discomfort.  Epigastric pain has decreased.  She has not had an EGD or H. pylori test.  Denies NSAID use.  No more heartburn.  She is concerned about being on long-term PPI.  She denies NSAID use.   Current Outpatient Medications  Medication Sig Dispense Refill   hydrochlorothiazide (MICROZIDE) 12.5 MG capsule Take 12.5 mg by mouth daily.     Multiple Vitamin (MULTIVITAMIN WITH MINERALS) TABS tablet Take 1 tablet by mouth daily.     omeprazole (PRILOSEC) 40 MG capsule Take 1 capsule (40 mg total) by mouth daily. 30 capsule 3   No current facility-administered medications for this visit.    Allergies as of 03/19/2023   (No Known Allergies)    Past Medical History:  Diagnosis Date   CMTD (Charcot-Marie-Tooth disease)    GERD (gastroesophageal reflux disease)    Headache    Hypertension    Neuromuscular disorder (HCC)    Charcot-Marie-Tooth Disease   Spina bifida (HCC)     Past Surgical History:  Procedure Laterality Date   CESAREAN SECTION     IR RADIOLOGIST EVAL & MGMT  09/17/2022   LAPAROSCOPIC TUBAL LIGATION Bilateral 08/29/2020   Procedure: LAPAROSCOPIC TUBAL LIGATION;  Surgeon: Nadara Mustard, MD;  Location: [facility] ORS;  Service: Gynecology;  Laterality: Bilateral;    Review of Systems:    All systems reviewed and negative  except where noted in HPI.   Physical Examination:   There were no vitals taken for this visit.  General: Well-nourished, well-developed in no acute distress.  Eyes: No icterus. Conjunctivae pink. Mouth: Oropharyngeal mucosa moist and pink , no lesions erythema or exudate. Lungs: Clear to auscultation bilaterally. Non-labored. Heart: Regular rate and rhythm, no murmurs rubs or gallops.  Abdomen: Bowel sounds are normal; Abdomen is Soft; No hepatosplenomegaly, masses or hernias;  No Abdominal Tenderness; No guarding or rebound tenderness. Extremities: No lower extremity edema. No clubbing or deformities. Neuro: Alert and oriented x 3.  Grossly intact. Skin: Warm and dry, no jaundice.   Psych: Alert and cooperative, normal mood and affect.   Imaging Studies: DG UGI W DOUBLE CM (HD BA)  Result Date: 03/07/2023 CLINICAL DATA:  Patient with early satiety, globus sensation EXAM: UPPER GI SERIES WITH HIGH DENSITY WITHOUT KUB TECHNIQUE: Combined double and single contrast examination was performed using effervescent crystals, high-density barium and thin liquid barium. This exam was performed by Mina Marble, PA-C , and was supervised and interpreted by Dr. Agustin Cree. FLUOROSCOPY: Radiation Exposure Index (as provided by the fluoroscopic device): 22.20 mGy Kerma COMPARISON:  None Available. FINDINGS: Esophagus: Normal appearance. Esophageal motility: Within normal limits. Gastroesophageal reflux: None demonstrated. Ingested 13mm barium tablet: No obstruction to the passage of a 13 mm tablet. Stomach: Normal. Gastric emptying: Normal. Duodenum: Normal appearance. Other:  None. IMPRESSION: Normal upper GI examination. Electronically Signed   By: Agustin Cree M.D.   On: 03/07/2023 11:00    Assessment and Plan:   Kathleen Joseph is a 37 y.o. y/o female returns for 1 month follow-up of GERD and epigastric pain.  Symptoms greatly improved on omeprazole 40 Mg once daily, yet not completely resolved.  Upper GI  series was normal.  I am ordering EGD and H. pylori breath test.  Evaluate for H. pylori gastritis, or peptic ulcer.  1.  Epigastric pain  Schedule H. pylori breath test in 1 month, after she has been off PPI for 2 weeks.  Schedule EGD in 6 to 8 weeks. I discussed risks of EGD with patient to include risk of bleeding, perforation, and risk of sedation.   Patient expressed understanding and agrees to proceed with EGD.   If upper GI symptoms return, she may need to be on long-term low-dose PPI.  2.  GERD  Try weaning off PPI. Decrease omeprazole to 20 mg once daily for the next week.  Then 20 mg every other day for 1 week.  Then discontinue. Recommend Lifestyle Modifications to prevent Acid Reflux.  Rec. Avoid coffee, sodas, peppermint, citrus fruits, and spicey foods.  Avoid eating 2-3 hours before bedtime.   Celso Amy, PA-C  Follow up in 3 months.
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
## 2023-03-27 ENCOUNTER — Encounter: Payer: Self-pay | Admitting: Orthopedic Surgery

## 2023-03-27 ENCOUNTER — Ambulatory Visit (INDEPENDENT_AMBULATORY_CARE_PROVIDER_SITE_OTHER): Payer: Medicaid Other | Admitting: Orthopedic Surgery

## 2023-03-27 DIAGNOSIS — S22080S Wedge compression fracture of T11-T12 vertebra, sequela: Secondary | ICD-10-CM

## 2023-03-27 DIAGNOSIS — G600 Hereditary motor and sensory neuropathy: Secondary | ICD-10-CM

## 2023-03-27 DIAGNOSIS — S22080D Wedge compression fracture of T11-T12 vertebra, subsequent encounter for fracture with routine healing: Secondary | ICD-10-CM

## 2023-03-27 DIAGNOSIS — M21372 Foot drop, left foot: Secondary | ICD-10-CM | POA: Diagnosis not present

## 2023-03-27 DIAGNOSIS — M21371 Foot drop, right foot: Secondary | ICD-10-CM | POA: Diagnosis not present

## 2023-04-01 ENCOUNTER — Ambulatory Visit
Admission: RE | Admit: 2023-04-01 | Discharge: 2023-04-01 | Disposition: A | Discharge status: Home / Self Care | Payer: Medicaid Other | Source: Ambulatory Visit | Attending: Orthopedic Surgery | Admitting: Orthopedic Surgery

## 2023-04-01 DIAGNOSIS — M21371 Foot drop, right foot: Secondary | ICD-10-CM

## 2023-04-01 DIAGNOSIS — S22080S Wedge compression fracture of T11-T12 vertebra, sequela: Secondary | ICD-10-CM

## 2023-04-01 DIAGNOSIS — G600 Hereditary motor and sensory neuropathy: Secondary | ICD-10-CM

## 2023-04-01 HISTORY — PX: IR RADIOLOGIST EVAL & MGMT: IMG5224

## 2023-04-01 NOTE — Progress Notes (Signed)
Chief Complaint: Patient was seen in consultation today for low back pain at the request of Luna,Stacy  Referring Physician(s): Luna,Stacy  History of Present Illness: Kathleen Joseph is a 37 y.o. female with a relevant past medical history of Charcot-Marie-Tooth who was initially seen Interventional Radiology clinic for T11 compression fracture with delayed healing in December of 2023.  At that time, she had a symptomatic T11 fracture sustained in a fall from a ladder and was deemed a candidate for kyphoplasty.  However, due to some personal issues she decided not to proceed with the procedure.    Since that time, her personal issues have resolved but her back pain has persisted and evolved.  Her pain is no longer focused in the lower thoracic spine but is now more lower lumbar with radiation into her hips bilaterally.   She had repeat MRI of the T and L spine on 03/11/23.  Her T11 fracture is healing and shows decreased edema.  However, she has progressive changes of degenerative disc disease at L4-L5 with new endplate marrow edema.  I strongly suspect this is the source of her pain.     Past Medical History:  Diagnosis Date   CMTD (Charcot-Marie-Tooth disease)    GERD (gastroesophageal reflux disease)    Headache    Hypertension    Neuromuscular disorder (HCC)    Charcot-Marie-Tooth Disease   Spina bifida (HCC)     Past Surgical History:  Procedure Laterality Date   CESAREAN SECTION     IR RADIOLOGIST EVAL & MGMT  09/17/2022   IR RADIOLOGIST EVAL & MGMT  04/01/2023   LAPAROSCOPIC TUBAL LIGATION Bilateral 08/29/2020   Procedure: LAPAROSCOPIC TUBAL LIGATION;  Surgeon: Nadara Mustard, MD;  Location: [facility] ORS;  Service: Gynecology;  Laterality: Bilateral;    Allergies: Patient has no known allergies.  Medications: Prior to Admission medications   Medication Sig Start Date End Date Taking? Authorizing Provider  hydrochlorothiazide (MICROZIDE) 12.5 MG capsule Take 12.5 mg  by mouth daily. 06/09/20   [provider]  Multiple Vitamin (MULTIVITAMIN WITH MINERALS) TABS tablet Take 1 tablet by mouth daily.    [provider]  omeprazole (PRILOSEC) 40 MG capsule Take 1 capsule (40 mg total) by mouth daily. 02/19/23   Celso Amy, PA-C     No family history on file.  Social History   Socioeconomic History   Marital status: Single    Spouse name: Not on file   Number of children: Not on file   Years of education: Not on file   Highest education level: Not on file  Occupational History   Not on file  Tobacco Use   Smoking status: Former    Types: Cigarettes   Smokeless tobacco: Never  Vaping Use   Vaping Use: Never used  Substance and Sexual Activity   Alcohol use: Yes    Alcohol/week: 5.0 standard drinks of alcohol    Types: 5 Cans of beer per week   Drug use: Never   Sexual activity: Not Currently    Birth control/protection: I.U.D.  Other Topics Concern   Not on file  Social History Narrative   Not on file   Social Determinants of Health   Financial Resource Strain: Not on file  Food Insecurity: Not on file  Transportation Needs: Not on file  Physical Activity: Not on file  Stress: Not on file  Social Connections: Not on file    Review of Systems: A 12 point ROS discussed and pertinent positives  are indicated in the HPI above.  All other systems are negative.  Review of Systems  Vital Signs: There were no vitals taken for this visit.    Physical Exam Constitutional:      Appearance: Normal appearance. She is normal weight.  HENT:     Head: Normocephalic and atraumatic.  Eyes:     General: No scleral icterus. Cardiovascular:     Rate and Rhythm: Normal rate.  Pulmonary:     Effort: Pulmonary effort is normal.  Abdominal:     General: Abdomen is flat.     Palpations: Abdomen is soft.  Musculoskeletal:       Back:     Comments: No TTP at the T11 level.  Significant   Skin:    General: Skin is warm and  dry.  Neurological:     Mental Status: She is alert and oriented to person, place, and time.  Psychiatric:        Behavior: Behavior normal.      Imaging: IR Radiologist Eval & Mgmt  Result Date: 04/01/2023 EXAM: NEW PATIENT OFFICE VISIT CHIEF COMPLAINT: SEE EPIC NOTE HISTORY OF PRESENT ILLNESS: SEE EPIC NOTE REVIEW OF SYSTEMS: SEE EPIC NOTE PHYSICAL EXAMINATION: SEE EPIC NOTE ASSESSMENT AND PLAN: SEE EPIC NOTE Electronically Signed   By: Malachy Moan M.D.   On: 04/01/2023 16:25   MR THORACIC SPINE WO CONTRAST  Result Date: 03/20/2023 CLINICAL DATA:  Compression fracture, thoracic. Mid to low back pain and pain in the hips. EXAM: MRI THORACIC SPINE WITHOUT CONTRAST TECHNIQUE: Multiplanar, multisequence MR imaging of the thoracic spine was performed. No intravenous contrast was administered. COMPARISON:  Thoracic spine MRI 09/08/2022 FINDINGS: Alignment:  Moderate thoracic dextroscoliosis.  No listhesis. Vertebrae: T11 superior endplate compression fracture with unchanged, mild vertebral body height loss and decreased, mild residual marrow edema. No new fracture. 1 cm STIR hyperintense focus in the right-sided T7 posterior elements, unchanged from 2021 and likely benign. Persistent minimal degenerative endplate edema at Z6-1. New minimal degenerative endplate edema at W9-6. Cord: Partially imaged cervicothoracic spinal cord syrinx, similar to the prior study and measuring up to 3 mm in diameter at C7. Intermittently visible syrinx throughout the thoracic spinal cord measures no more than 1.5 mm in diameter. Paraspinal and other soft tissues: Chronic cerebellar tonsillar ectopia noted on large field-of-view sagittal counting sequence consistent with previously described Chiari I malformation. Disc levels: Unchanged mild multilevel disc bulging and facet arthrosis without stenosis. IMPRESSION: 1. T11 compression fracture with decreased, mild residual marrow edema and unchanged vertebral body height  loss. 2. No new fracture. 3. Unchanged mild thoracic spondylosis without stenosis. 4. Unchanged cervicothoracic syrinx. Electronically Signed   By: Sebastian Ache M.D.   On: 03/20/2023 17:03   MR LUMBAR SPINE WO CONTRAST  Result Date: 03/20/2023 CLINICAL DATA:  Low back pain, symptoms persist with > 6 wks treatment. Mid to low back pain and pain in the hips. EXAM: MRI LUMBAR SPINE WITHOUT CONTRAST TECHNIQUE: Multiplanar, multisequence MR imaging of the lumbar spine was performed. No intravenous contrast was administered. COMPARISON:  Lumbar spine CT 07/28/2022 and MRI 11/29/2021 FINDINGS: Segmentation:  Minimally transitional L5 segment. Alignment:  Slight lumbar levoscoliosis.  No listhesis. Vertebrae: No fracture or suspicious marrow lesion. Prominent degenerative endplate edema at E4-5, increased from the prior MRI. Incomplete fusion of the L5 posterior elements. Conus medullaris and cauda equina: Conus extends to the L1 level. Conus and cauda equina appear normal. Paraspinal and other soft tissues: Unremarkable. Disc levels: T12-L1  through L3-4: Negative. L4-5: Disc desiccation and moderate disc space narrowing. Disc bulging results in minimal bilateral neural foraminal narrowing, unchanged from the prior MRI. No spinal stenosis. L5-S1: Negative. IMPRESSION: Increased degenerative endplate edema at Z6-1 which may be a source of back pain. No evidence of neural impingement. Electronically Signed   By: Sebastian Ache M.D.   On: 03/20/2023 16:52   DG UGI W DOUBLE CM (HD BA)  Result Date: 03/07/2023 CLINICAL DATA:  Patient with early satiety, globus sensation EXAM: UPPER GI SERIES WITH HIGH DENSITY WITHOUT KUB TECHNIQUE: Combined double and single contrast examination was performed using effervescent crystals, high-density barium and thin liquid barium. This exam was performed by Mina Marble, PA-C , and was supervised and interpreted by Dr. Agustin Cree. FLUOROSCOPY: Radiation Exposure Index (as provided by the  fluoroscopic device): 22.20 mGy Kerma COMPARISON:  None Available. FINDINGS: Esophagus: Normal appearance. Esophageal motility: Within normal limits. Gastroesophageal reflux: None demonstrated. Ingested 13mm barium tablet: No obstruction to the passage of a 13 mm tablet. Stomach: Normal. Gastric emptying: Normal. Duodenum: Normal appearance. Other:  None. IMPRESSION: Normal upper GI examination. Electronically Signed   By: Agustin Cree M.D.   On: 03/07/2023 11:00    Labs:  CBC: No results for input(s): "WBC", "HGB", "HCT", "PLT" in the last 8760 hours.  COAGS: No results for input(s): "INR", "APTT" in the last 8760 hours.  BMP: No results for input(s): "NA", "K", "CL", "CO2", "GLUCOSE", "BUN", "CALCIUM", "CREATININE", "GFRNONAA", "GFRAA" in the last 8760 hours.  Invalid input(s): "CMP"  LIVER FUNCTION TESTS: No results for input(s): "BILITOT", "AST", "ALT", "ALKPHOS", "PROT", "ALBUMIN" in the last 8760 hours.  TUMOR MARKERS: No results for input(s): "AFPTM", "CEA", "CA199", "CHROMGRNA" in the last 8760 hours.  Assessment and Plan:  37 year-old female with CMT and a history of T11 fracture with persistent low back pain.  Her T11 fracture is hgealing and seems unrelated to her current back pain.  It seems that the progressive DJD at L4-L5 is the primary source for her low back pain.  This should respond to a combination of NSAIDs and epidural steroid injection.   1.) Begin OTC naproxen BID 2.) Schedule for L4-L5 ESI  Thank you for this interesting consult.  I greatly enjoyed meeting [address] and look forward to participating in their care.  A copy of this report was sent to the requesting provider on this date.  Electronically Signed: Sterling Big 04/01/2023, 5:07 PM   I spent a total of  15 Minutes in face to face in clinical consultation, greater than 50% of which was counseling/coordinating care for low back pain.

## 2023-04-02 ENCOUNTER — Other Ambulatory Visit: Payer: Self-pay | Admitting: Orthopedic Surgery

## 2023-04-02 DIAGNOSIS — S32000A Wedge compression fracture of unspecified lumbar vertebra, initial encounter for closed fracture: Secondary | ICD-10-CM

## 2023-04-22 ENCOUNTER — Telehealth: Payer: Self-pay

## 2023-04-22 NOTE — Telephone Encounter (Signed)
/  Dr. Allegra Lai is not going to be in the o/office on 05/16/2023 and we need to reschedule patient EGD. Called and reschedule EGD to 05/29/2023. Mailed out new instructions with this date on it.  Called and trish of the moved

## 2023-04-28 NOTE — Discharge Instructions (Signed)

## 2023-04-29 ENCOUNTER — Ambulatory Visit: Admission: RE | Admit: 2023-04-29 | Payer: Medicaid Other | Source: Ambulatory Visit

## 2023-04-29 ENCOUNTER — Telehealth: Payer: Self-pay | Admitting: Orthopedic Surgery

## 2023-04-29 DIAGNOSIS — S22080S Wedge compression fracture of T11-T12 vertebra, sequela: Secondary | ICD-10-CM

## 2023-04-29 DIAGNOSIS — G600 Hereditary motor and sensory neuropathy: Secondary | ICD-10-CM

## 2023-04-29 DIAGNOSIS — S32000A Wedge compression fracture of unspecified lumbar vertebra, initial encounter for closed fracture: Secondary | ICD-10-CM

## 2023-04-29 MED ORDER — METHYLPREDNISOLONE ACETATE 40 MG/ML INJ SUSP (RADIOLOG
80.0000 mg | Freq: Once | INTRAMUSCULAR | Status: AC
  Administered 2023-04-29: 80 mg via EPIDURAL

## 2023-04-29 MED ORDER — IOPAMIDOL (ISOVUE-M 200) INJECTION 41%
1.0000 mL | Freq: Once | INTRAMUSCULAR | Status: AC
  Administered 2023-04-29: 1 mL via EPIDURAL

## 2023-04-29 NOTE — Telephone Encounter (Signed)
Spoke with patient. Had right L4-L5 IL ESI with [facility]. Has not seen much relief yet.   They want to repeat the injection in 3 weeks and she is agreeable to this. Will put in order.  She continues with constant numbness at her bra strap area in her back with mid to lower back pain that is constant. 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   She also has not heard back from pain management. ([facility]).   Please send referrals to [facility] in [place], [facility], and [facility] in [place].   I scheduled her for a f/u with me in 6-8 weeks. Please mail her the appointment reminder.

## 2023-04-30 ENCOUNTER — Other Ambulatory Visit: Payer: Self-pay | Admitting: Orthopedic Surgery

## 2023-04-30 DIAGNOSIS — M5416 Radiculopathy, lumbar region: Secondary | ICD-10-CM

## 2023-04-30 NOTE — Telephone Encounter (Signed)
Referrals have been faxed to all 3 offices to see who can get her in sooner.

## 2023-04-30 NOTE — Progress Notes (Signed)
See note from yesterday. Repeat ESI ordered.

## 2023-05-29 ENCOUNTER — Encounter
Admission: RE | Disposition: A | Discharge status: Home / Self Care | Payer: Self-pay | Source: Home / Self Care | Attending: Gastroenterology

## 2023-05-29 ENCOUNTER — Ambulatory Visit
Admission: RE | Admit: 2023-05-29 | Discharge: 2023-05-29 | Disposition: A | Discharge status: Home / Self Care | Payer: Medicaid Other | Attending: Gastroenterology | Admitting: Gastroenterology

## 2023-05-29 ENCOUNTER — Ambulatory Visit: Payer: Medicaid Other | Admitting: Certified Registered"

## 2023-05-29 ENCOUNTER — Other Ambulatory Visit: Payer: Self-pay

## 2023-05-29 ENCOUNTER — Encounter: Payer: Self-pay | Admitting: Gastroenterology

## 2023-05-29 DIAGNOSIS — K219 Gastro-esophageal reflux disease without esophagitis: Secondary | ICD-10-CM | POA: Diagnosis not present

## 2023-05-29 DIAGNOSIS — K319 Disease of stomach and duodenum, unspecified: Secondary | ICD-10-CM | POA: Diagnosis not present

## 2023-05-29 DIAGNOSIS — I10 Essential (primary) hypertension: Secondary | ICD-10-CM | POA: Diagnosis not present

## 2023-05-29 DIAGNOSIS — R1013 Epigastric pain: Secondary | ICD-10-CM

## 2023-05-29 DIAGNOSIS — K229 Disease of esophagus, unspecified: Secondary | ICD-10-CM | POA: Diagnosis not present

## 2023-05-29 DIAGNOSIS — Q059 Spina bifida, unspecified: Secondary | ICD-10-CM | POA: Diagnosis not present

## 2023-05-29 DIAGNOSIS — R12 Heartburn: Secondary | ICD-10-CM | POA: Diagnosis not present

## 2023-05-29 DIAGNOSIS — Z87891 Personal history of nicotine dependence: Secondary | ICD-10-CM | POA: Insufficient documentation

## 2023-05-29 DIAGNOSIS — G600 Hereditary motor and sensory neuropathy: Secondary | ICD-10-CM | POA: Diagnosis not present

## 2023-05-29 HISTORY — PX: BIOPSY: SHX5522

## 2023-05-29 HISTORY — PX: ESOPHAGOGASTRODUODENOSCOPY (EGD) WITH PROPOFOL: SHX5813

## 2023-05-29 LAB — POCT PREGNANCY, URINE: Preg Test, Ur: NEGATIVE

## 2023-05-29 SURGERY — ESOPHAGOGASTRODUODENOSCOPY (EGD) WITH PROPOFOL
Anesthesia: General

## 2023-05-29 MED ORDER — LIDOCAINE HCL (CARDIAC) PF 100 MG/5ML IV SOSY
PREFILLED_SYRINGE | INTRAVENOUS | Status: DC | PRN
  Administered 2023-05-29: 50 mg via INTRAVENOUS

## 2023-05-29 MED ORDER — SODIUM CHLORIDE 0.9 % IV SOLN: INTRAVENOUS | Status: DC

## 2023-05-29 MED ORDER — DEXMEDETOMIDINE HCL IN NACL 80 MCG/20ML IV SOLN
INTRAVENOUS | Status: DC | PRN
  Administered 2023-05-29: 8 ug via INTRAVENOUS

## 2023-05-29 MED ORDER — GLYCOPYRROLATE 0.2 MG/ML IJ SOLN
INTRAMUSCULAR | Status: DC | PRN
  Administered 2023-05-29: .2 mg via INTRAVENOUS

## 2023-05-29 MED ORDER — PROPOFOL 10 MG/ML IV BOLUS
INTRAVENOUS | Status: DC | PRN
  Administered 2023-05-29 (×2): 10 mg via INTRAVENOUS
  Administered 2023-05-29: 150 mg via INTRAVENOUS
  Administered 2023-05-29: 20 mg via INTRAVENOUS
  Administered 2023-05-29: 10 mg via INTRAVENOUS

## 2023-05-29 NOTE — Op Note (Signed)
[facility] Gastroenterology Patient Name: Kathleen Joseph Procedure Date: 05/29/2023 10:58 AM MRN: [id number] Account #: [id number] Date of Birth: [birth year] Admit Type: Outpatient Age: 37 Room: [facility] ENDO ROOM 3 Gender: Female Note Status: Finalized Instrument Name: Upper Endoscope [id number] Procedure:             Upper GI endoscopy Indications:           Epigastric abdominal pain, Heartburn Providers:             Toney Reil MD, MD Referring MD:          Lorenda Cahill Medicines:             General Anesthesia Complications:         No immediate complications. Estimated blood loss: None. Procedure:             Pre-Anesthesia Assessment:                        - Prior to the procedure, a History and Physical was                         performed, and patient medications and allergies were                         reviewed. The patient is competent. The risks and                         benefits of the procedure and the sedation options and                         risks were discussed with the patient. All questions                         were answered and informed consent was obtained.                         Patient identification and proposed procedure were                         verified by the physician, the nurse, the                         anesthesiologist, the anesthetist and the technician                         in the pre-procedure area in the procedure room in the                         endoscopy suite. Mental Status Examination: alert and                         oriented. Airway Examination: normal oropharyngeal                         airway and neck mobility. Respiratory Examination:                         clear to auscultation. CV Examination: normal.  Prophylactic Antibiotics: The patient does not require                         prophylactic antibiotics. Prior Anticoagulants: The                         patient has  taken no anticoagulant or antiplatelet                         agents. ASA Grade Assessment: II - A patient with mild                         systemic disease. After reviewing the risks and                         benefits, the patient was deemed in satisfactory                         condition to undergo the procedure. The anesthesia                         plan was to use general anesthesia. Immediately prior                         to administration of medications, the patient was                         re-assessed for adequacy to receive sedatives. The                         heart rate, respiratory rate, oxygen saturations,                         blood pressure, adequacy of pulmonary ventilation, and                         response to care were monitored throughout the                         procedure. The physical status of the patient was                         re-assessed after the procedure.                        After obtaining informed consent, the endoscope was                         passed under direct vision. Throughout the procedure,                         the patient's blood pressure, pulse, and oxygen                         saturations were monitored continuously. The Endoscope                         was introduced through the mouth, and advanced to the  second part of duodenum. The upper GI endoscopy was                         accomplished without difficulty. The patient tolerated                         the procedure well. Findings:      The duodenal bulb and second portion of the duodenum were normal.       Biopsies for histology were taken with a cold forceps for evaluation of       celiac disease.      The entire examined stomach was normal. Biopsies were taken with a cold       forceps for Helicobacter pylori testing.      The cardia and gastric fundus were normal on retroflexion.      Esophagogastric landmarks were identified: the  gastroesophageal junction       was found at 36 cm from the incisors.      The gastroesophageal junction and examined esophagus were normal.       Biopsies were taken with a cold forceps for histology. Impression:            - Normal duodenal bulb and second portion of the                         duodenum. Biopsied.                        - Normal stomach. Biopsied.                        - Esophagogastric landmarks identified.                        - Normal gastroesophageal junction and esophagus.                         Biopsied. Recommendation:        - Await pathology results.                        - Discharge patient to home (with escort).                        - Resume previous diet today.                        - Continue present medications. Procedure Code(s):     --- Professional ---                        [phone], Esophagogastroduodenoscopy, flexible,                         transoral; with biopsy, single or multiple Diagnosis Code(s):     --- Professional ---                        R10.13, Epigastric pain                        R12, Heartburn CPT copyright 2022 American Medical Association. All rights reserved. The codes documented in this report are preliminary  and upon coder review may  be revised to meet current compliance requirements. Dr. Libby Maw Toney Reil MD, MD 05/29/2023 11:20:26 AM This report has been signed electronically. Number of Addenda: 0 Note Initiated On: 05/29/2023 10:58 AM Estimated Blood Loss:  Estimated blood loss: none.      [facility]

## 2023-05-29 NOTE — Anesthesia Postprocedure Evaluation (Signed)
Anesthesia Post Note  Patient: Investment banker, corporate  Procedure(s) Performed: ESOPHAGOGASTRODUODENOSCOPY (EGD) WITH PROPOFOL BIOPSY  Patient location during evaluation: Endoscopy Anesthesia Type: General Level of consciousness: awake and alert Pain management: pain level controlled Vital Signs Assessment: post-procedure vital signs reviewed and stable Respiratory status: spontaneous breathing, nonlabored ventilation, respiratory function stable and patient connected to nasal cannula oxygen Cardiovascular status: blood pressure returned to baseline and stable Postop Assessment: no apparent nausea or vomiting Anesthetic complications: no  No notable events documented.   Last Vitals:  Vitals:   05/29/23 1134 05/29/23 1142  BP: 119/64 113/71  Pulse: 73 62  Resp: 12 12  Temp:    SpO2: 100% 100%    Last Pain:  Vitals:   05/29/23 1121  TempSrc: Temporal  PainSc: 0-No pain                 Stephanie Coup

## 2023-05-29 NOTE — Transfer of Care (Signed)
Immediate Anesthesia Transfer of Care Note  Patient: Allison Quarry  Procedure(s) Performed: ESOPHAGOGASTRODUODENOSCOPY (EGD) WITH PROPOFOL BIOPSY  Patient Location: PACU and Endoscopy Unit  Anesthesia Type:General  Level of Consciousness: awake  Airway & Oxygen Therapy: Patient Spontanous Breathing  Post-op Assessment: Report given to RN and Post -op Vital signs reviewed and stable  Post vital signs: Reviewed and stable  Last Vitals:  Vitals Value Taken Time  BP 110/61 05/29/23 1123  Temp 36.1 C 05/29/23 1121  Pulse 66 05/29/23 1123  Resp 13 05/29/23 1123  SpO2 98 % 05/29/23 1123  Vitals shown include unfiled device data.  Last Pain:  Vitals:   05/29/23 1121  TempSrc: Temporal  PainSc: 0-No pain         Complications: No notable events documented.

## 2023-05-29 NOTE — Anesthesia Procedure Notes (Signed)
Procedure Name: MAC Date/Time: 05/29/2023 11:07 AM  Performed by: Cheral Bay, CRNAPre-anesthesia Checklist: Patient identified, Emergency Drugs available, Suction available, Patient being monitored and Timeout performed Patient Re-evaluated:Patient Re-evaluated prior to induction Oxygen Delivery Method: Nasal cannula Induction Type: IV induction Placement Confirmation: positive ETCO2 and CO2 detector

## 2023-05-29 NOTE — Anesthesia Preprocedure Evaluation (Signed)
Anesthesia Evaluation  Patient identified by MRN, date of birth, ID band Patient awake    Reviewed: Allergy & Precautions, NPO status , Patient's Chart, lab work & pertinent test results  Airway Mallampati: III  TM Distance: >3 FB Neck ROM: full    Dental  (+) Chipped   Pulmonary neg pulmonary ROS, former smoker   Pulmonary exam normal        Cardiovascular hypertension, negative cardio ROS Normal cardiovascular exam     Neuro/Psych negative neurological ROS  negative psych ROS   GI/Hepatic Neg liver ROS,GERD  Medicated,,  Endo/Other  negative endocrine ROS    Renal/GU negative Renal ROS  negative genitourinary   Musculoskeletal   Abdominal   Peds  Hematology negative hematology ROS (+)   Anesthesia Other Findings Past Medical History: No date: CMTD (Charcot-Marie-Tooth disease) No date: GERD (gastroesophageal reflux disease) No date: Headache No date: Hypertension No date: Neuromuscular disorder (HCC)     Comment:  Charcot-Marie-Tooth Disease No date: Spina bifida [facility])  Past Surgical History: No date: CESAREAN SECTION 09/17/2022: IR RADIOLOGIST EVAL & MGMT 04/01/2023: IR RADIOLOGIST EVAL & MGMT 08/29/2020: LAPAROSCOPIC TUBAL LIGATION; Bilateral     Comment:  Procedure: LAPAROSCOPIC TUBAL LIGATION;  Surgeon:               Nadara Mustard, MD;  Location: [facility] ORS;  Service:               Gynecology;  Laterality: Bilateral;  BMI    Body Mass Index: 23.17 kg/m      Reproductive/Obstetrics negative OB ROS                             Anesthesia Physical Anesthesia Plan  ASA: 2  Anesthesia Plan: General   Post-op Pain Management: Minimal or no pain anticipated   Induction: Intravenous  PONV Risk Score and Plan: 3 and Propofol infusion, TIVA and Ondansetron  Airway Management Planned: Nasal Cannula  Additional Equipment: None  Intra-op Plan:   Post-operative Plan:    Informed Consent: I have reviewed the patients History and Physical, chart, labs and discussed the procedure including the risks, benefits and alternatives for the proposed anesthesia with the patient or authorized representative who has indicated his/her understanding and acceptance.     Dental advisory given  Plan Discussed with: CRNA and Surgeon  Anesthesia Plan Comments: (Discussed risks of anesthesia with patient, including possibility of difficulty with spontaneous ventilation under anesthesia necessitating airway intervention, PONV, and rare risks such as cardiac or respiratory or neurological events, and allergic reactions. Discussed the role of CRNA in patient's perioperative care. Patient understands.)       Anesthesia Quick Evaluation

## 2023-05-29 NOTE — H&P (Signed)
Arlyss Repress, MD [address]  [address]  [place], [place] [address]  Main: [phone]  Fax: [phone] Pager: [phone]  Primary Care Physician:  Lorn Junes, FNP Primary Gastroenterologist:  Dr. Arlyss Repress  Pre-Procedure History & Physical: HPI:  Kathleen Joseph is a 37 y.o. female is here for an endoscopy.   Past Medical History:  Diagnosis Date   CMTD (Charcot-Marie-Tooth disease)    GERD (gastroesophageal reflux disease)    Headache    Hypertension    Neuromuscular disorder (HCC)    Charcot-Marie-Tooth Disease   Spina bifida (HCC)     Past Surgical History:  Procedure Laterality Date   CESAREAN SECTION     IR RADIOLOGIST EVAL & MGMT  09/17/2022   IR RADIOLOGIST EVAL & MGMT  04/01/2023   LAPAROSCOPIC TUBAL LIGATION Bilateral 08/29/2020   Procedure: LAPAROSCOPIC TUBAL LIGATION;  Surgeon: Nadara Mustard, MD;  Location: [facility] ORS;  Service: Gynecology;  Laterality: Bilateral;    Prior to Admission medications   Medication Sig Start Date End Date Taking? Authorizing Provider  hydrochlorothiazide (MICROZIDE) 12.5 MG capsule Take 12.5 mg by mouth daily. 06/09/20  Yes [provider]  Multiple Vitamin (MULTIVITAMIN WITH MINERALS) TABS tablet Take 1 tablet by mouth daily.   Yes [provider]  omeprazole (PRILOSEC) 40 MG capsule Take 1 capsule (40 mg total) by mouth daily. 02/19/23  Yes Celso Amy, PA-C    Allergies as of 03/19/2023   (No Known Allergies)    History reviewed. No pertinent family history.  Social History   Socioeconomic History   Marital status: Single    Spouse name: Not on file   Number of children: Not on file   Years of education: Not on file   Highest education level: Not on file  Occupational History   Not on file  Tobacco Use   Smoking status: Former    Types: Cigarettes   Smokeless tobacco: Never  Vaping Use   Vaping status: Never Used  Substance and Sexual Activity   Alcohol use: Yes     Alcohol/week: 5.0 standard drinks of alcohol    Types: 5 Cans of beer per week   Drug use: Never   Sexual activity: Not Currently    Birth control/protection: I.U.D.  Other Topics Concern   Not on file  Social History Narrative   Not on file   Social Determinants of Health   Financial Resource Strain: Not on file  Food Insecurity: Not on file  Transportation Needs: Not on file  Physical Activity: Not on file  Stress: Not on file  Social Connections: Not on file  Intimate Partner Violence: Not on file    Review of Systems: See HPI, otherwise negative ROS  Physical Exam: BP 125/85   Pulse 65   Temp (!) 97.4 F (36.3 C) (Temporal)   Wt 61.2 kg   SpO2 100%   BMI 23.17 kg/m  General:   Alert,  pleasant and cooperative in NAD Head:  Normocephalic and atraumatic. Neck:  Supple; no masses or thyromegaly. Lungs:  Clear throughout to auscultation.    Heart:  Regular rate and rhythm. Abdomen:  Soft, nontender and nondistended. Normal bowel sounds, without guarding, and without rebound.   Neurologic:  Alert and  oriented x4;  grossly normal neurologically.  Impression/Plan: Kathleen Joseph is here for an endoscopy to be performed for epigastric pain  Risks, benefits, limitations, and alternatives regarding  endoscopy have been reviewed with the patient.  Questions have been  answered.  All parties agreeable.   Lannette Donath, MD  05/29/2023, 10:07 AM

## 2023-05-30 ENCOUNTER — Encounter: Payer: Self-pay | Admitting: Gastroenterology

## 2023-06-03 ENCOUNTER — Encounter: Payer: Self-pay | Admitting: Gastroenterology

## 2023-06-09 ENCOUNTER — Telehealth: Payer: Self-pay | Admitting: Orthopedic Surgery

## 2023-06-09 NOTE — Telephone Encounter (Signed)
7/30 injections at [facility]. She is at the point that she can not bend over. She has pain in her hips. She can not lay on her stomach due to the pain. Do you want to see her again or MD?
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
## 2023-06-09 NOTE — Telephone Encounter (Signed)
She confirmed appt for 06/11/2023

## 2023-06-09 NOTE — Telephone Encounter (Signed)
Okay to keep follow up with me.   Can move up if needed, but please have her see me on a day either Myer Haff or Katrinka Blazing are here.

## 2023-06-11 ENCOUNTER — Ambulatory Visit: Payer: Medicaid Other | Admitting: Orthopedic Surgery

## 2023-06-12 ENCOUNTER — Encounter: Payer: Self-pay | Admitting: Orthopedic Surgery

## 2023-06-12 ENCOUNTER — Ambulatory Visit (INDEPENDENT_AMBULATORY_CARE_PROVIDER_SITE_OTHER): Payer: Medicaid Other | Admitting: Orthopedic Surgery

## 2023-06-12 VITALS — BP 130/78 | Ht 64.0 in | Wt 138.4 lb

## 2023-06-12 DIAGNOSIS — M5136 Other intervertebral disc degeneration, lumbar region: Secondary | ICD-10-CM

## 2023-06-12 DIAGNOSIS — G600 Hereditary motor and sensory neuropathy: Secondary | ICD-10-CM | POA: Diagnosis not present

## 2023-06-12 DIAGNOSIS — M47816 Spondylosis without myelopathy or radiculopathy, lumbar region: Secondary | ICD-10-CM | POA: Diagnosis not present

## 2023-06-12 DIAGNOSIS — S22080D Wedge compression fracture of T11-T12 vertebra, subsequent encounter for fracture with routine healing: Secondary | ICD-10-CM

## 2023-06-12 NOTE — Patient Instructions (Signed)
It was so nice to see you today. Thank you so much for coming in.  I am sorry that you are hurting so much.   I think your back pain is from degeneration of the disc at L4-L5. The leg pain is likely from your CMT.   I want you to see [facility] in [place]. They should call you to schedule an appointment or you can call them at [phone].   You are likely a surgery candidate for your lower back. It would be a fusion at L4-L5. Prior to considering this, you would need to do PT. We also may consider a procedure called a discogram.   You also may be a candidate for a spinal cord stimulator to help with your leg pain.   I want you to see neurology at the [facility] for your CMT. They should call you to schedule an appointment or you can call them at [phone].   I will message you in 4 weeks to check on you. Please do not hesitate to call if you have any questions or concerns. You can also message me in MyChart.   Drake Leach PA-C [phone]
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
## 2023-06-17 ENCOUNTER — Ambulatory Visit: Payer: Medicaid Other | Admitting: Orthopedic Surgery

## 2023-06-19 ENCOUNTER — Ambulatory Visit: Payer: Medicaid Other | Admitting: Physician Assistant

## 2023-06-20 ENCOUNTER — Other Ambulatory Visit: Payer: Self-pay | Admitting: Physician Assistant

## 2023-06-20 DIAGNOSIS — R1013 Epigastric pain: Secondary | ICD-10-CM

## 2023-06-23 IMAGING — CR DG THORACIC SPINE 2V
2 series · 2 of 2 positions shown · non-contrast
Comparison: MRI 10/15/2019.

CLINICAL DATA: Thoracic spine pain.

EXAM:
THORACIC SPINE 2 VIEWS

[t-spine ap]
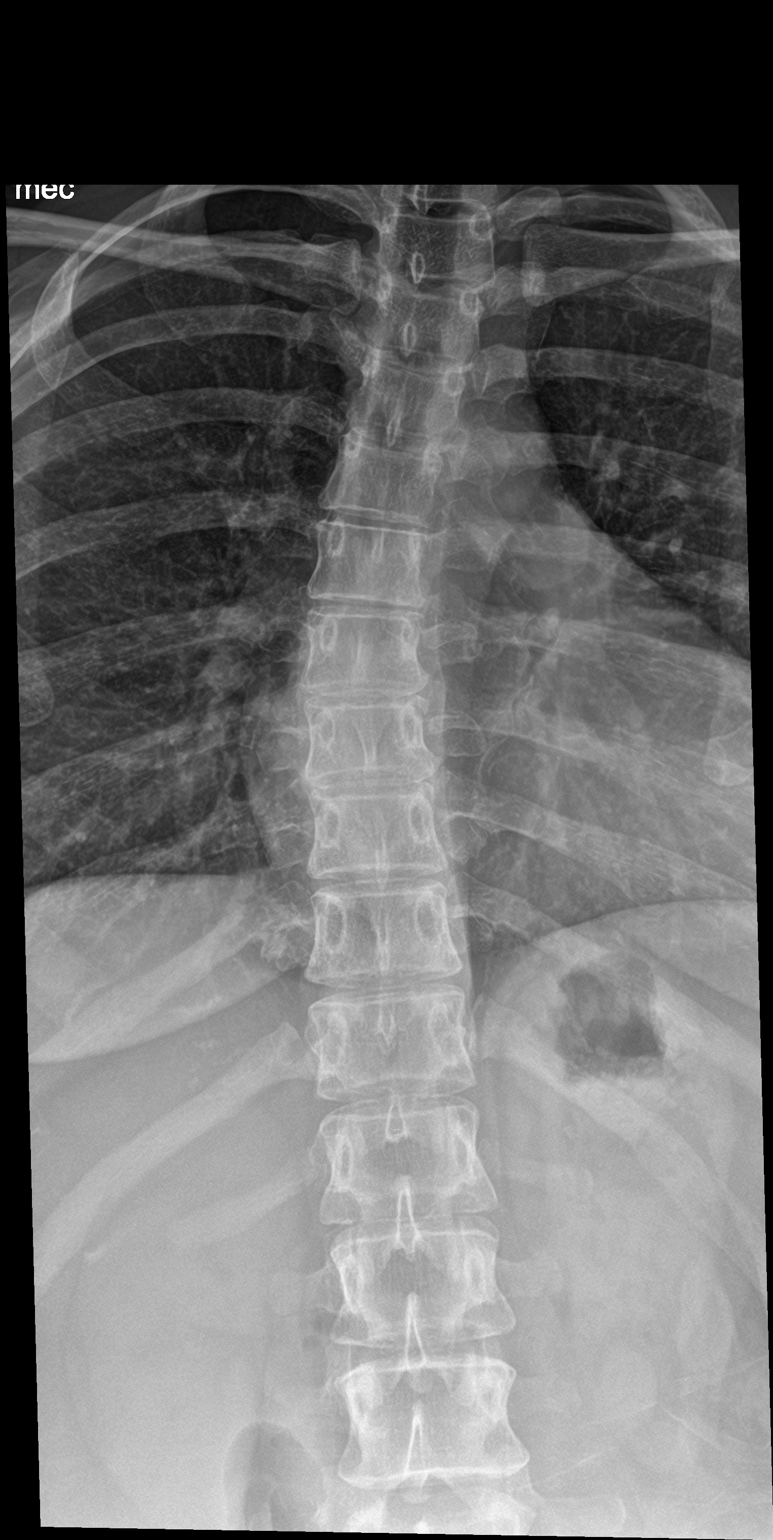

[t-spine lat]
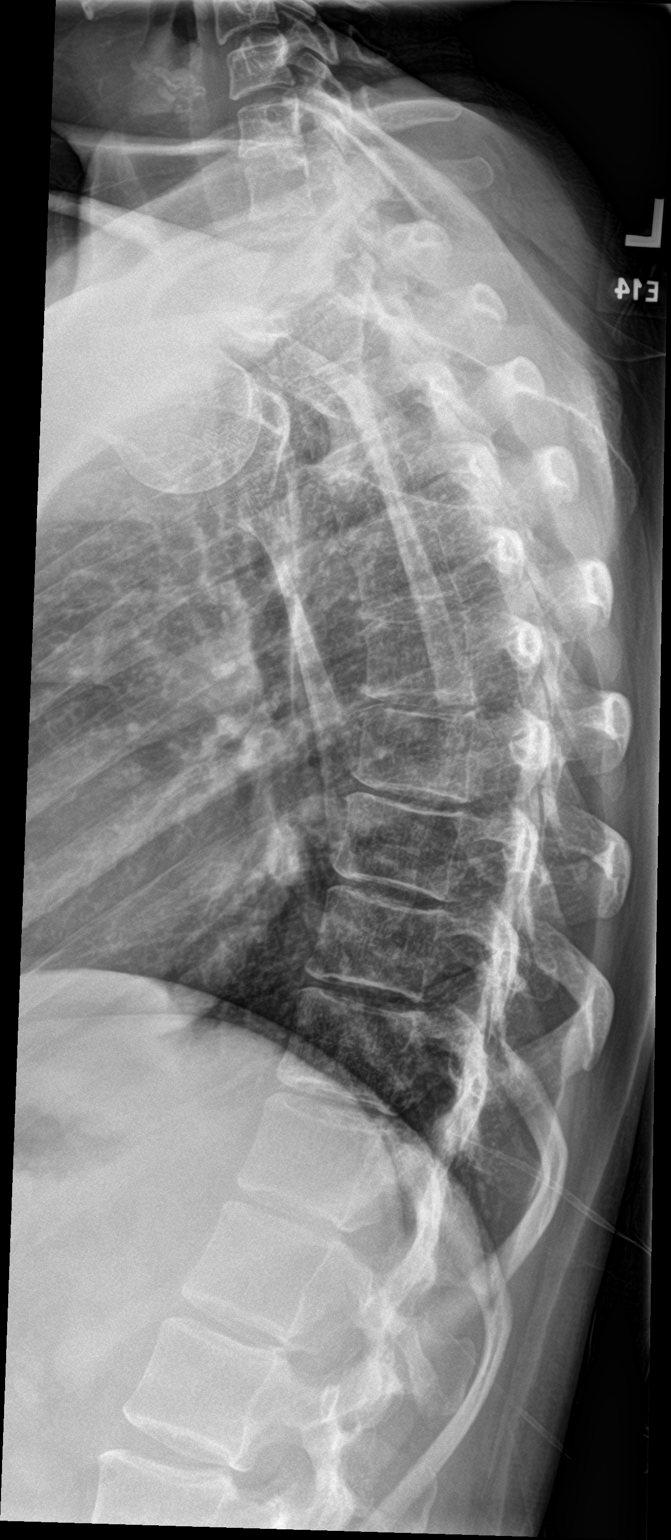

[2 of 2 positions shown; findings below may reference images not displayed]

FINDINGS: Midthoracic spine scoliosis concave left 16 degrees. Mild multilevel
degenerative change. No evidence of fracture. No acute or focal bony
abnormality identified.
IMPRESSION: Midthoracic spine scoliosis concave left 16 degrees. Mild multilevel
degenerative change. No acute or focal bony abnormality.

## 2023-06-23 IMAGING — CR DG CERVICAL SPINE COMPLETE 4+V
5 series · 5 of 5 positions shown · non-contrast
Comparison: MRI cervical spine 09/16/2019.

CLINICAL DATA: Cervical pain.  No known injury.

EXAM:
CERVICAL SPINE - COMPLETE 4+ VIEW

[c-spine lat]
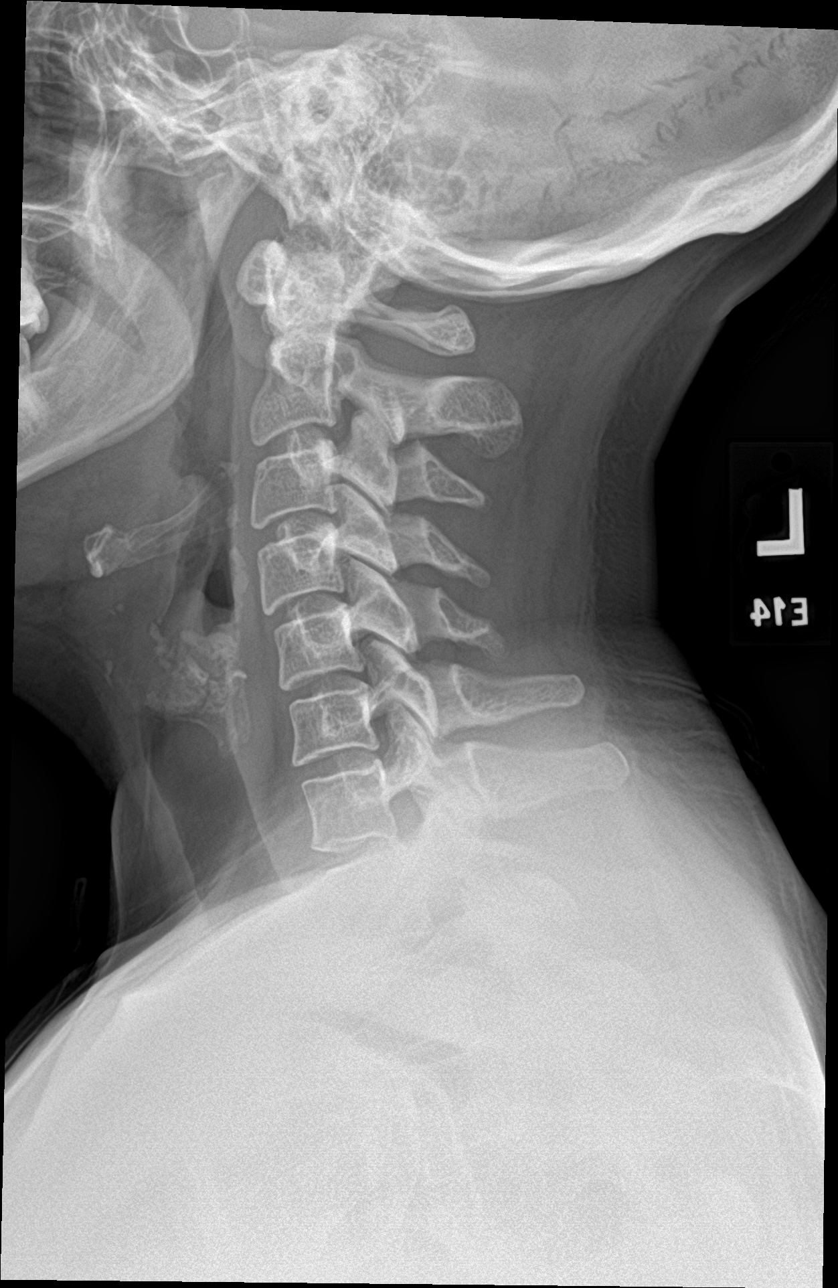

[c-spine obl (1 of 2)]
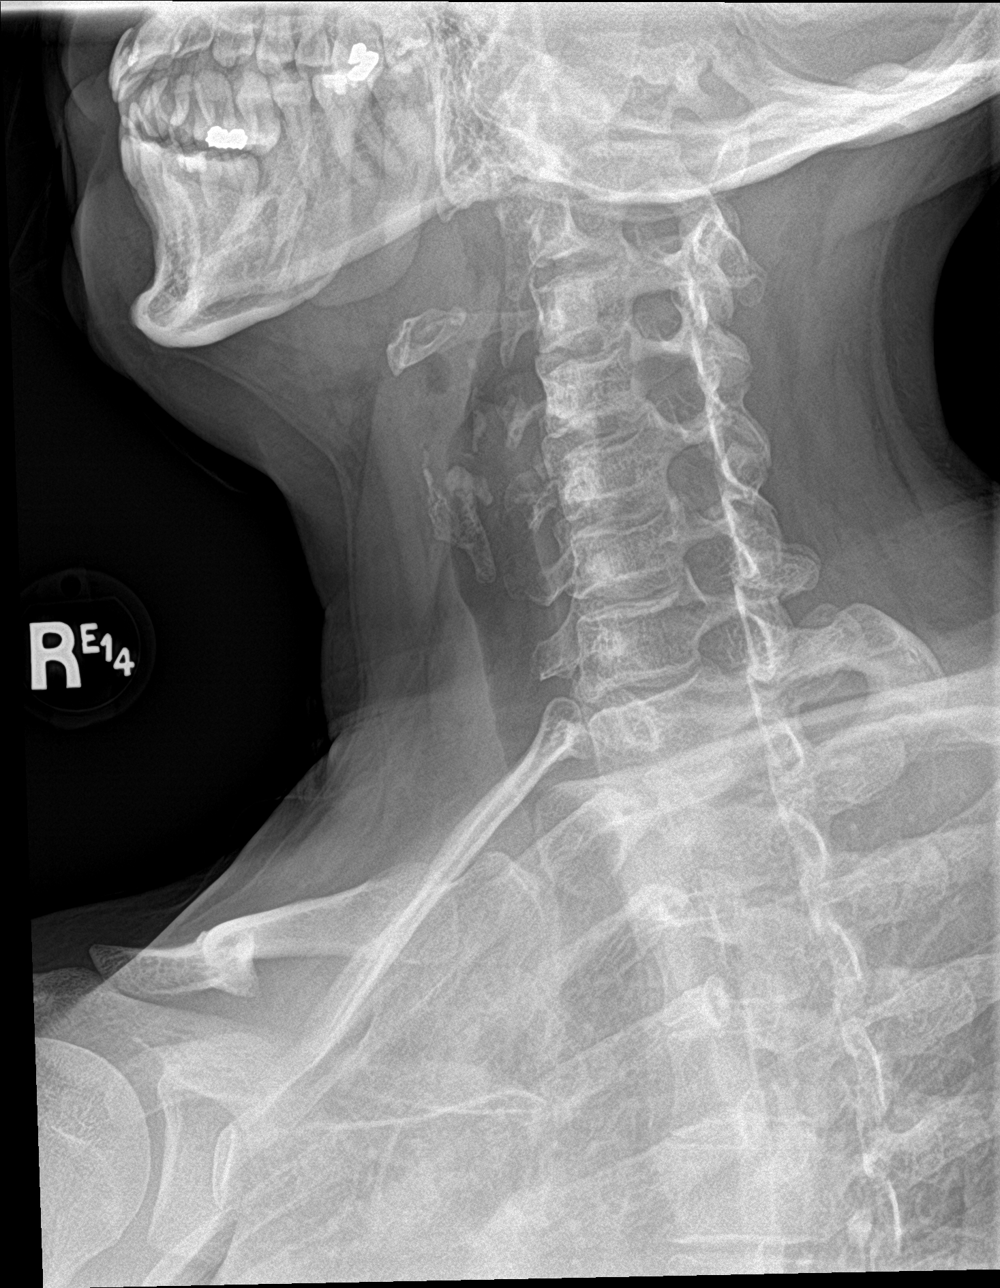

[c-spine obl (2 of 2)]
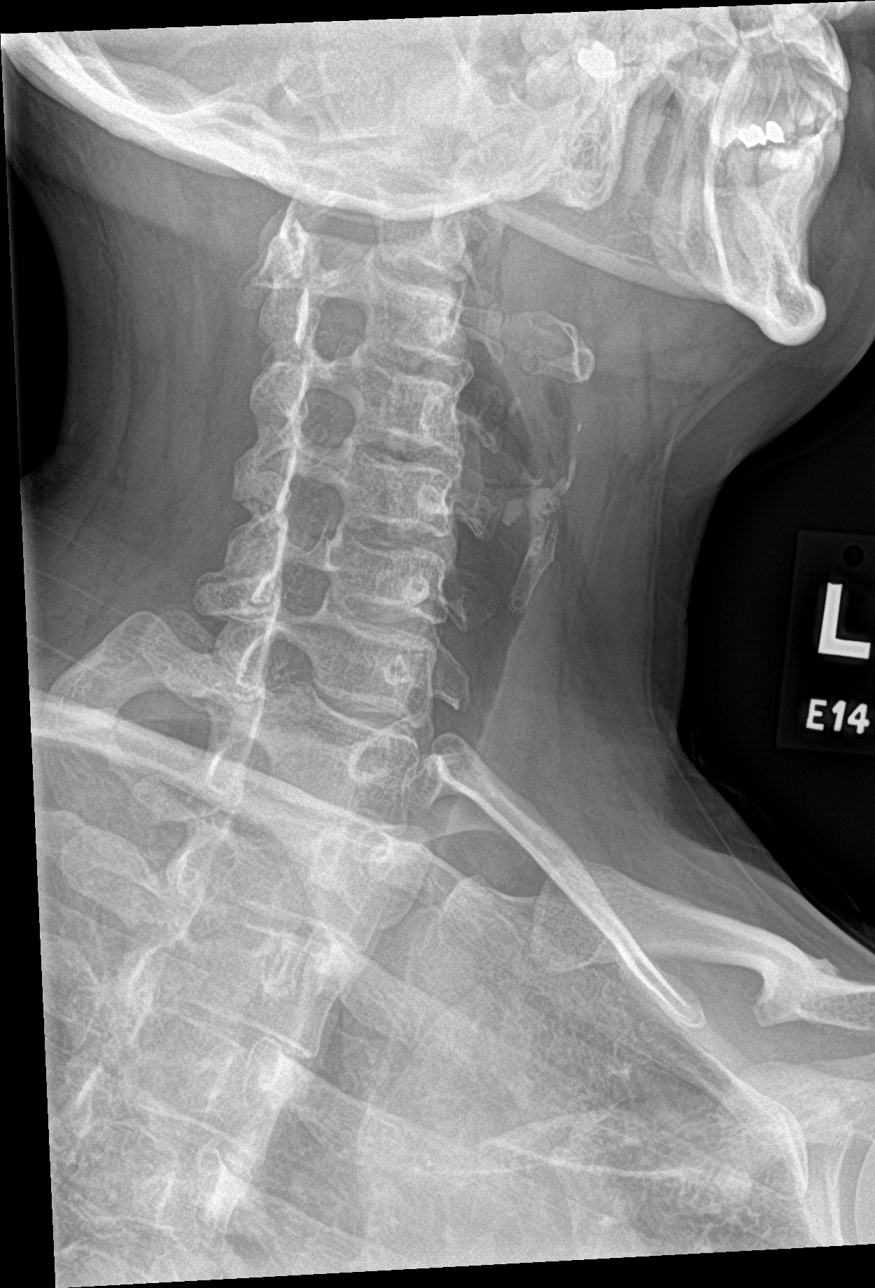

[c-spine ap]
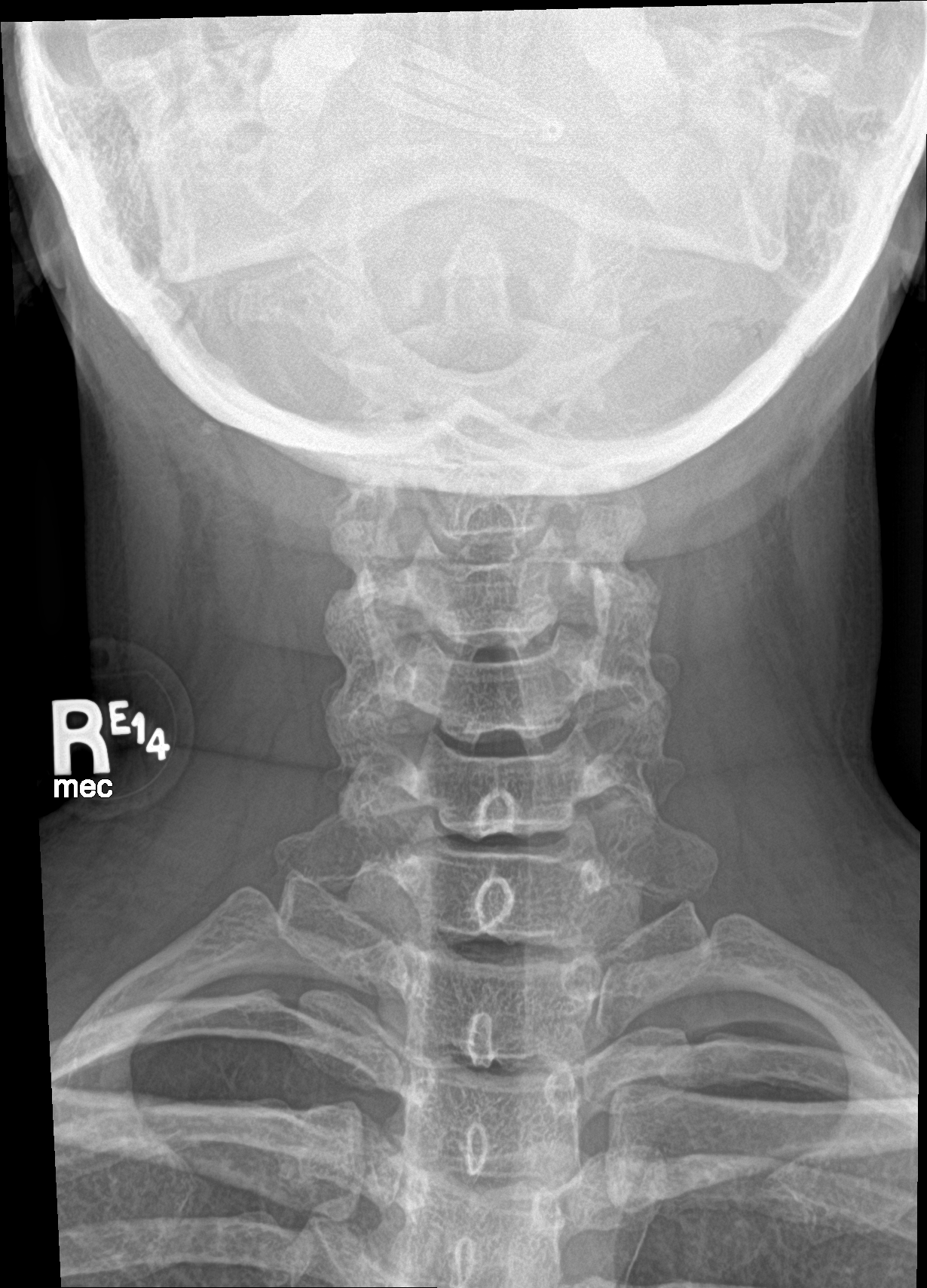

[c-spine open mouth]
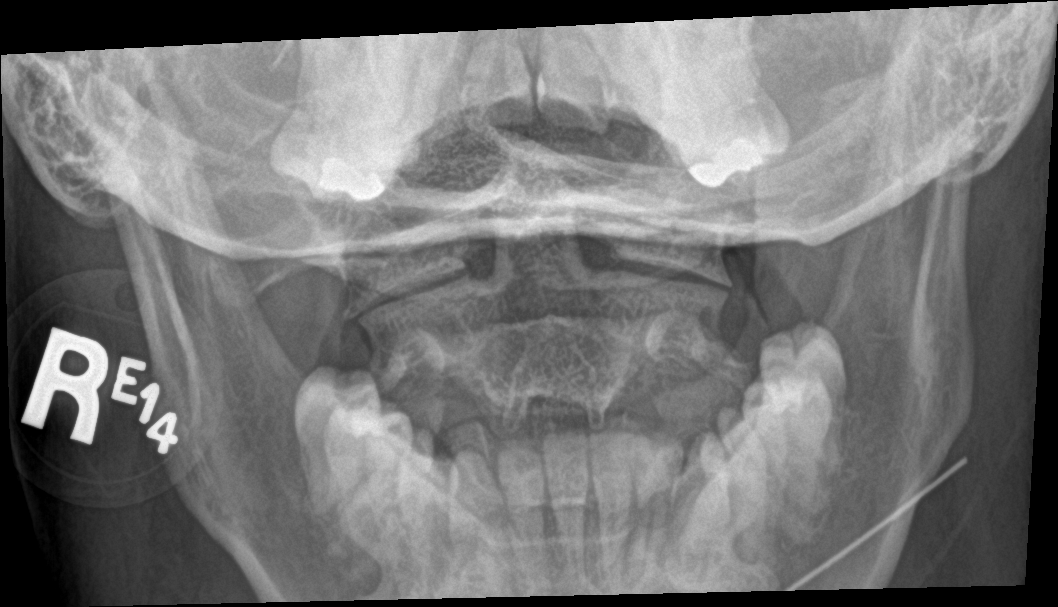

[5 of 5 positions shown; findings below may reference images not displayed]

FINDINGS: Mild diffuse facet hypertrophy. No acute or focal bony or joint
abnormality. No evidence of fracture or dislocation. Mild bilateral
apical pleural thickening consistent scarring.
IMPRESSION: Mild diffuse facet hypertrophy. No acute or focal abnormality
identified.

## 2023-06-23 IMAGING — CR DG LUMBAR SPINE COMPLETE 4+V
5 series · 5 of 5 positions shown · non-contrast
Comparison: MRI 10/15/2019.

CLINICAL DATA: Low back pain.

EXAM:
LUMBAR SPINE - COMPLETE 4+ VIEW

[l-spine ap]
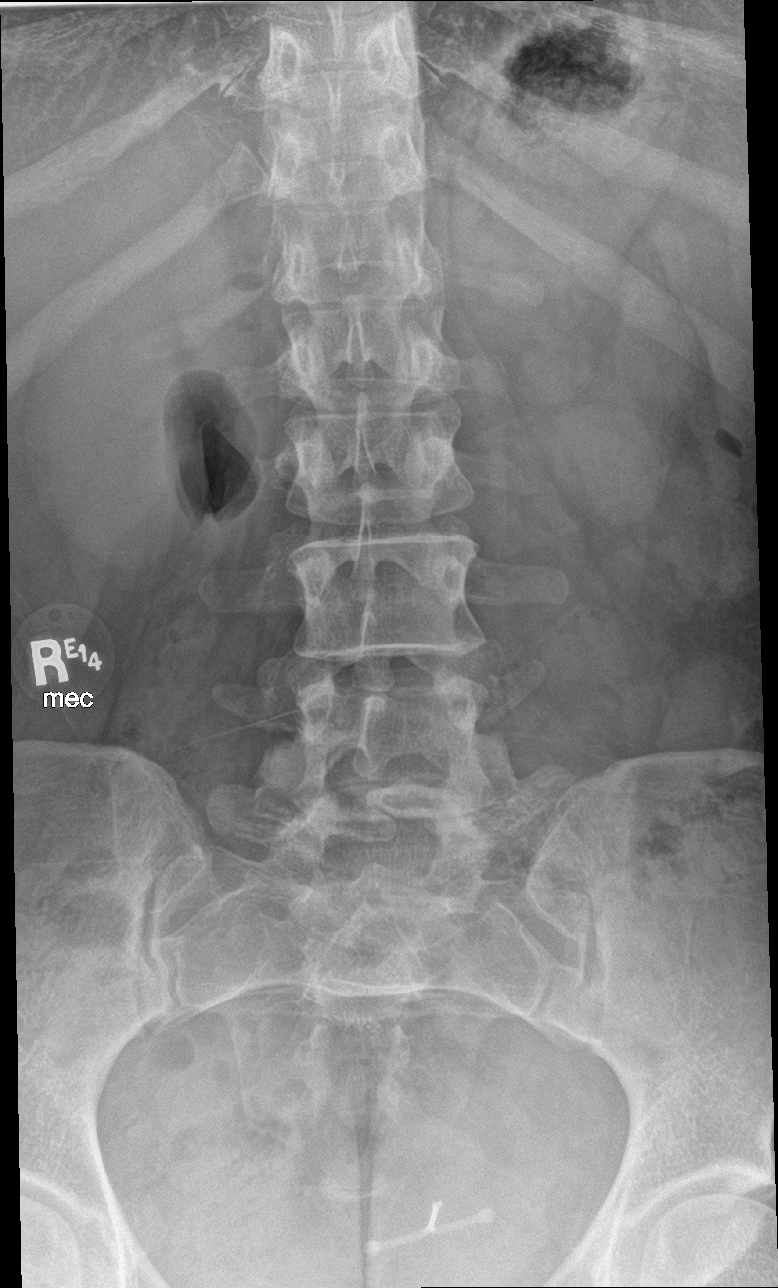

[l-spine obl (1 of 2)]
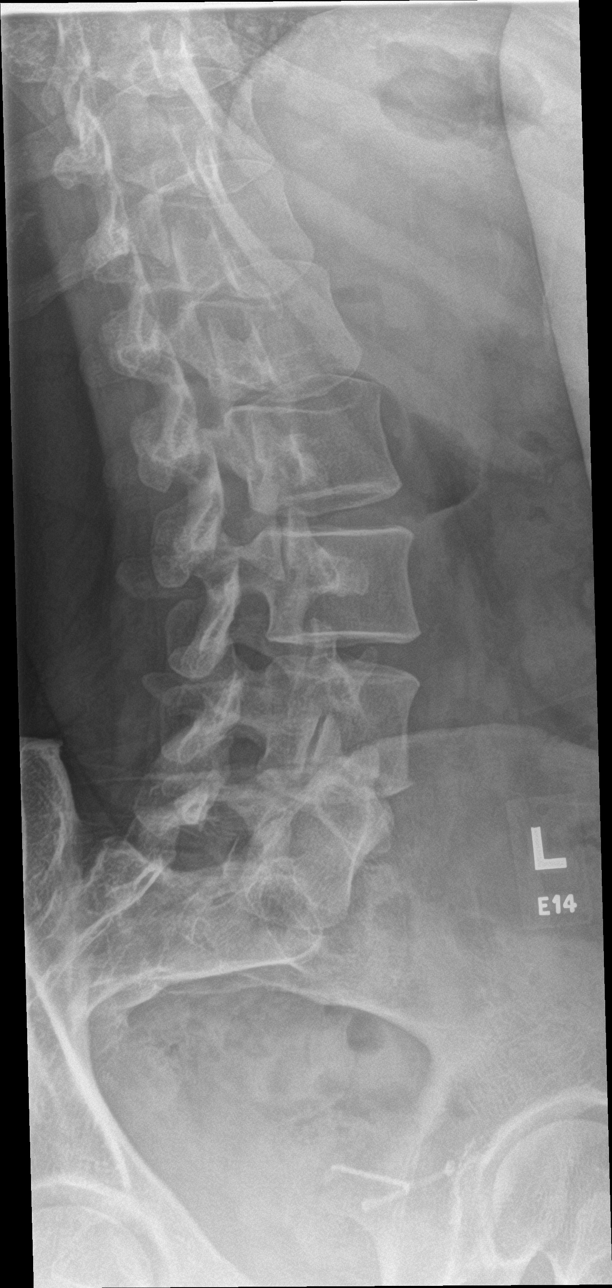

[l-spine obl (2 of 2)]
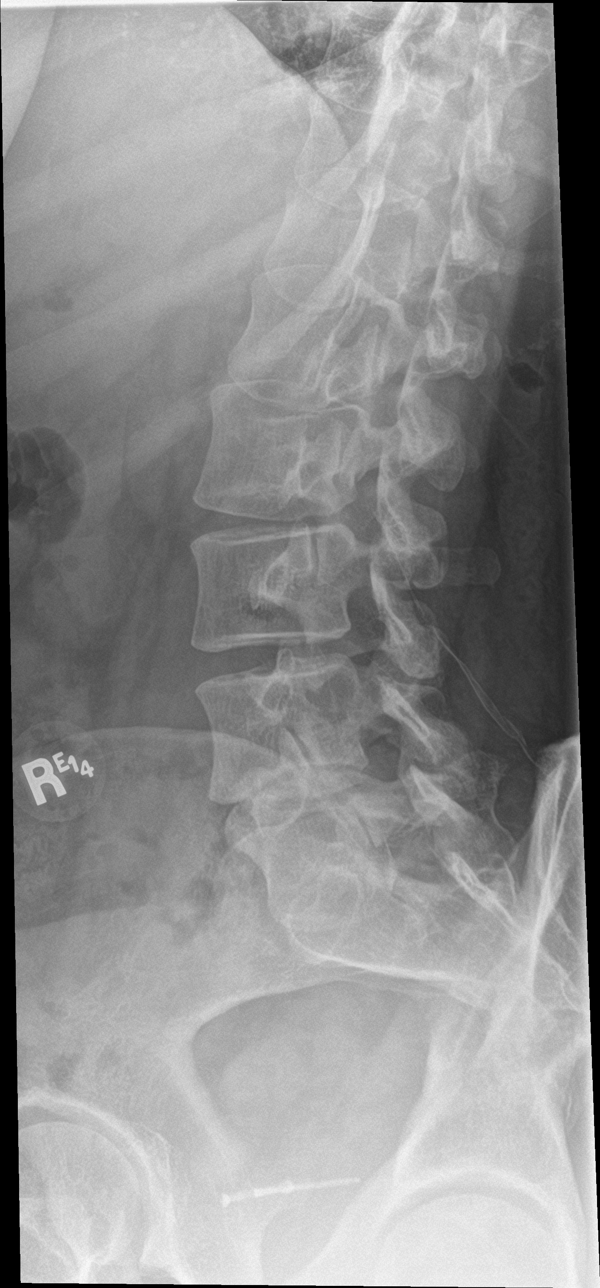

[l-spine lat]
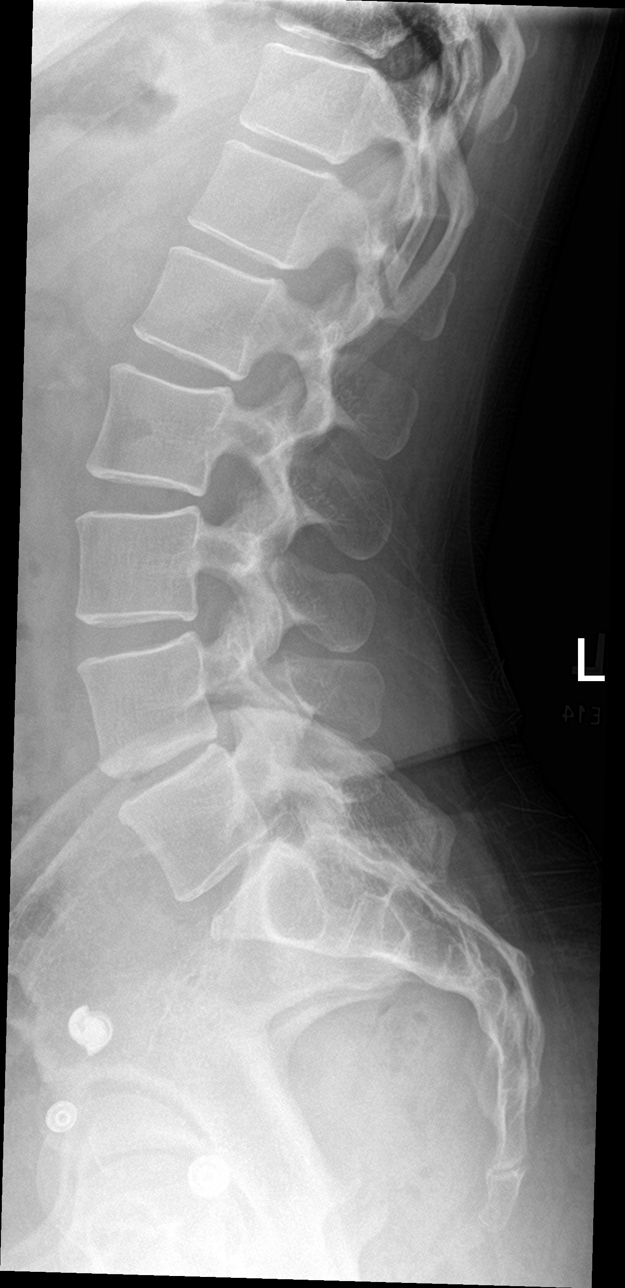

[l-spine spot]
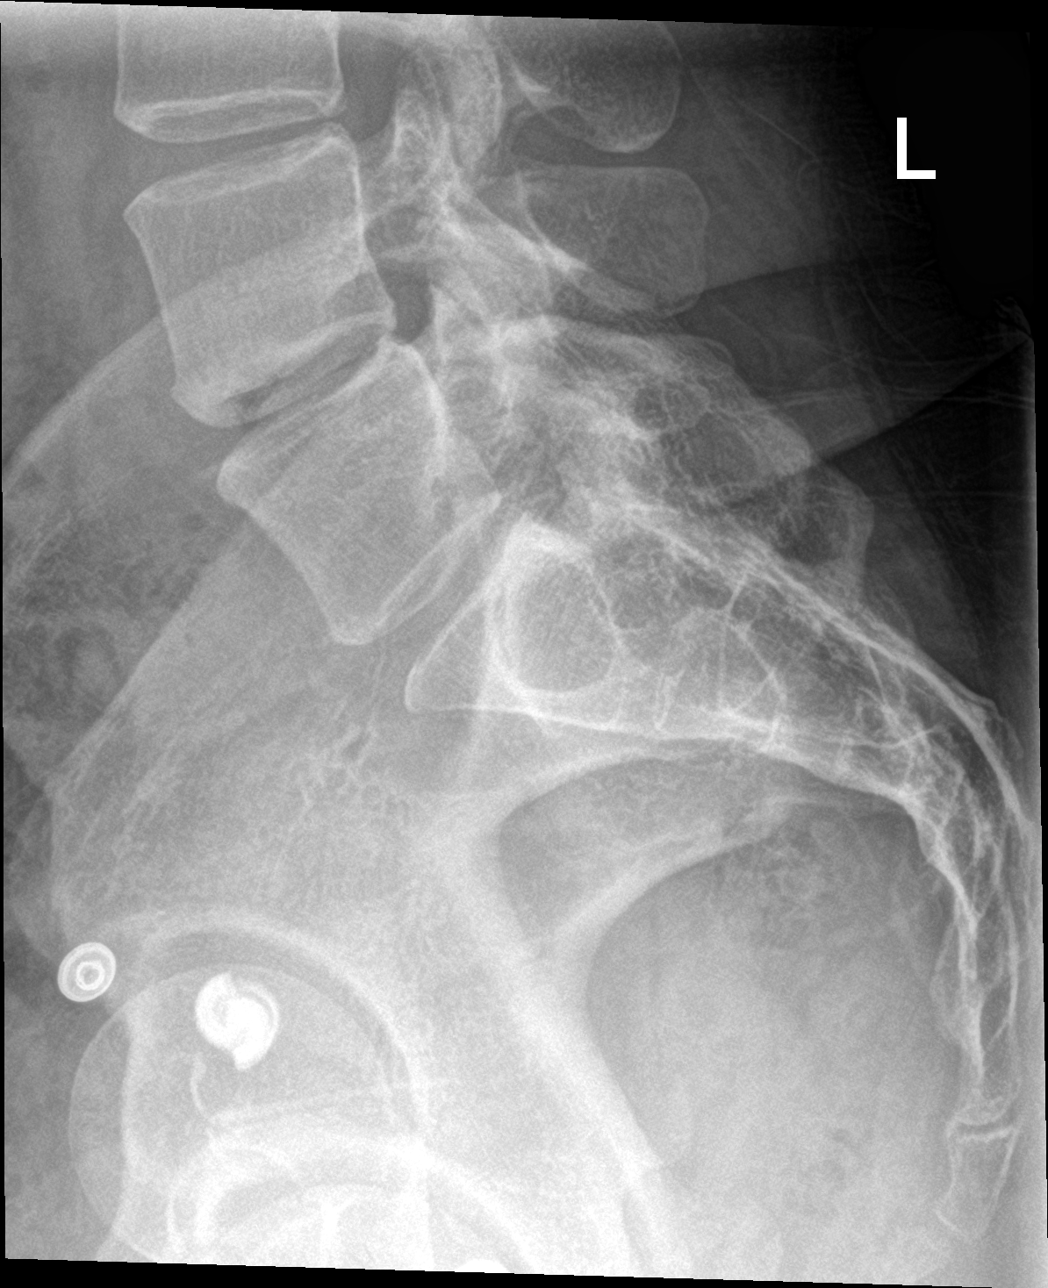

[5 of 5 positions shown; findings below may reference images not displayed]

FINDINGS: Lumbar spine numbered the lowest segmented appearing lumbar shaped
vertebra as L5. Mild L4-L5 and L5-S1 disc degeneration. Spina bifida
L5. No acute bony abnormality identified. Paraspinal soft tissues
normal. IUD noted in the pelvis.
IMPRESSION: 1. Mild degenerative changes L4-L5 and L5-S1. Spina bifida at L5. No
acute bony abnormality identified.

2.  IUD noted in the pelvis.

## 2023-07-14 ENCOUNTER — Telehealth: Payer: Self-pay | Admitting: Orthopedic Surgery

## 2023-07-14 NOTE — Telephone Encounter (Signed)
Please call to check on her. Has she see [facility] yet?   I saw Neurology at [facility] called her to schedule and left a VM. She can call them at [phone] to schedule her appointment.   Thanks.

## 2023-07-22 NOTE — Progress Notes (Deleted)
    Celso Amy, PA-C [address]  [address]  [place], [place] [address]  Main: [phone]  Fax: [phone]   Primary Care Physician: Lorn Junes, FNP  Primary Gastroenterologist:  ***  CC: F/U GERD and Epigastric Pain  HPI: Kathleen Joseph is a 37 y.o. female 4 month f/u GERD Epigastric Pain.  EGD 05/2023 by Dr. Allegra Lai was normal.  Biopsies were negative for H. pylori and celiac.  Biopsy showed reflux esophagitis.  Currently taking omeprazole 20 mg daily.  Current symptoms   Current Outpatient Medications  Medication Sig Dispense Refill   hydrochlorothiazide (MICROZIDE) 12.5 MG capsule Take 12.5 mg by mouth daily.     Multiple Vitamin (MULTIVITAMIN WITH MINERALS) TABS tablet Take 1 tablet by mouth daily.     omeprazole (PRILOSEC) 40 MG capsule TAKE 1 CAPSULE(40 MG) BY MOUTH DAILY 90 capsule 3   No current facility-administered medications for this visit.    Allergies as of 07/23/2023   (No Known Allergies)    Past Medical History:  Diagnosis Date   CMTD (Charcot-Marie-Tooth disease)    GERD (gastroesophageal reflux disease)    Headache    Hypertension    Neuromuscular disorder (HCC)    Charcot-Marie-Tooth Disease   Spina bifida (HCC)     Past Surgical History:  Procedure Laterality Date   BIOPSY  05/29/2023   Procedure: BIOPSY;  Surgeon: Toney Reil, MD;  Location: [facility];  Service: Gastroenterology;;   CESAREAN SECTION     ESOPHAGOGASTRODUODENOSCOPY (EGD) WITH PROPOFOL N/A 05/29/2023   Procedure: ESOPHAGOGASTRODUODENOSCOPY (EGD) WITH PROPOFOL;  Surgeon: Toney Reil, MD;  Location: [facility];  Service: Gastroenterology;  Laterality: N/A;   IR RADIOLOGIST EVAL & MGMT  09/17/2022   IR RADIOLOGIST EVAL & MGMT  04/01/2023   LAPAROSCOPIC TUBAL LIGATION Bilateral 08/29/2020   Procedure: LAPAROSCOPIC TUBAL LIGATION;  Surgeon: Nadara Mustard, MD;  Location: [facility];  Service: Gynecology;  Laterality: Bilateral;    Review of  Systems:    All systems reviewed and negative except where noted in HPI.   Physical Examination:   There were no vitals taken for this visit.  General: Well-nourished, well-developed in no acute distress.  Lungs: Clear to auscultation bilaterally. Non-labored. Heart: Regular rate and rhythm, no murmurs rubs or gallops.  Abdomen: Bowel sounds are normal; Abdomen is Soft; No hepatosplenomegaly, masses or hernias;  No Abdominal Tenderness; No guarding or rebound tenderness. Neuro: Alert and oriented x 3.  Grossly intact.  Psych: Alert and cooperative, normal mood and affect.   Imaging Studies: No results found.  Assessment and Plan:   Kathleen Joseph is a 37 y.o. y/o female ***    Celso Amy, PA-C  Follow up ***  BP check ***

## 2023-07-23 ENCOUNTER — Ambulatory Visit: Payer: Medicaid Other | Admitting: Physician Assistant
